# Patient Record
Sex: Male | Born: 1975 | Race: White | Hispanic: No | Marital: Married
Health system: Midwestern US, Community
[De-identification: ages and names within clinical notes are randomized; demographics above are authoritative.]

## PROBLEM LIST (undated history)

## (undated) DIAGNOSIS — I10 Essential (primary) hypertension: Secondary | ICD-10-CM

## (undated) DIAGNOSIS — E119 Type 2 diabetes mellitus without complications: Secondary | ICD-10-CM

## (undated) DIAGNOSIS — F901 Attention-deficit hyperactivity disorder, predominantly hyperactive type: Secondary | ICD-10-CM

## (undated) HISTORY — DX: Essential (primary) hypertension: I10

## (undated) HISTORY — DX: Type 2 diabetes mellitus without complications: E11.9

---

## 2009-01-22 NOTE — ED Provider Notes (Unsigned)
PATIENT NAME                  PA #             MR #                  [name]                 [id number]       [id number]            EMERGENCY ROOM PHYSICIAN                 ADM DATE                     Jeff Batman, MD                       01/22/2009                   DATE OF BIRTH    AGE            PATIENT TYPE      RM #               Jun 17, 1976       32             ERK                                     CHIEF COMPLAINT:  Bee sting.       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  He had what sounds to be a pretty serious  event when he was much younger and does not get stung that often so it has  been a few years since he history been stung and it does not sound like he  has had any further anaphylactic issues and it is not even clear that he had  anaphylaxis initially.  Regardless, he was stung on the left ear about 2   hours ago and about an 1 hour because he had some swelling develop under his  right axilla he took two Benadryl tablets.  This was about 45 minutes to an  hour ago and he feels a big groggy but at no point did he have any trouble  with speech, feel lightheaded, or have any palpitations, any shortness of  breath or any swelling about the mouth or throat.  He says other than feeling  a little groggy he feels fine.       PAST MEDICAL HISTORY:  Essentially negative for chronic medical problems.  He  did some episode or an irregular heart beat and fast but nothing chronic.  No  other current medications.       ALLERGIES:  BEE STINGS.       SOCIAL HISTORY:  He does not smoke.  Her drinks alcohol socially.      FAMILY HISTORY: Noncontributory.       REVIEW OF SYSTEMS:  All other systems reviewed and negative except as  mentioned above.       PHYSICAL EXAMINATION:  VITAL SIGNS:  Afebrile, pulse 76, respirations 18, and  blood pressure 149/80, oxygen 100% on room air.  GENERAL:  He is awake, alert  and very pleasant.  He appears  to be in no distress whatsoever.  ENT:  There  is no focal swelling about the mouth or throat.  He speaks fluently.  There  is no stridor.  NECK:  Supple.  CARDIOVASCULAR:  Normal S1 and S2.  No  murmurs.  LUNGS:  Sounds are clear without wheezing, rales or rhonchi.  He  breathes comfortably.  ABDOMEN:  Soft.  SKIN:  There is no diffuse urticaria  or even rash seen.  He has a little soft tissue fullness in the right axilla.   It is not a discrete mass and it is not erythematous.  NEUROLOGIC:  Awake,  alert and oriented x3 and nonfocal.       MEDICAL DECISION MAKING AND EMERGENCY DEPARTMENT COURSE:  The patient appears  to have a relatively mild allergic reaction.  Again he was stung over 2   hours ago.  He took his Benadryl and he has no signs of a global or  generalized allergic reaction.  I think at this point he can be safely  discharged.  I am going to have him take Benadryl about every 6 hours for the  next 24 hours or while having symptoms.  He was given 60 mg of prednisone  here.  I will continue that for 3 days.  He was given a prescription for 5  epi pens and was instructed on the physiology of allergic reactions including  its biphasic response.  They are comfortable in going home and their  questions have been answered.       IMPRESSION:  Allergic reaction to bee sting.      DISCHARGE INSTRUCTIONS:  As above.  Return if recurrent significant symptoms.                                         Jeff Batman, MD     KM /[id number]  DD: 01/22/2009 20:56  DT: 01/25/2009 10:51  Job #: [id number]  CC:

## 2013-11-10 ENCOUNTER — Ambulatory Visit (HOSPITAL_BASED_OUTPATIENT_CLINIC_OR_DEPARTMENT_OTHER): Payer: Self-pay

## 2013-11-12 ENCOUNTER — Ambulatory Visit (HOSPITAL_BASED_OUTPATIENT_CLINIC_OR_DEPARTMENT_OTHER): Payer: Self-pay | Attending: Ophthalmology | Admitting: Radiology

## 2013-11-12 VITALS — Ht 69.0 in | Wt 215.0 lb

## 2013-11-12 DIAGNOSIS — G4730 Sleep apnea, unspecified: Secondary | ICD-10-CM | POA: Insufficient documentation

## 2013-11-22 DIAGNOSIS — G4710 Hypersomnia, unspecified: Secondary | ICD-10-CM

## 2013-11-22 DIAGNOSIS — G4730 Sleep apnea, unspecified: Secondary | ICD-10-CM

## 2013-11-22 NOTE — Sleep Study (Signed)
   NAME: Kevin Yates DATE OF BIRTH:  21-Feb-1976 MEDICAL RECORD NUMBER [id number]  LOCATION: [facility]  PHYSICIAN: Nino Amano D Shirleen Mcfaul  DATE OF STUDY: 11/12/2013  SLEEP STUDY TYPE: Nocturnal Polysomnogram               REFERRING PHYSICIAN: Corinda Gubler, MD  INDICATION FOR STUDY: Hypersomnia with sleep apnea  EPWORTH SLEEPINESS SCORE:   9/24 HEIGHT: 5\' 9"  (175.3 cm)  WEIGHT: 215 lb (97.523 kg)    Body mass index is 31.74 kg/(m^2).  NECK SIZE: 17 in.  MEDICATIONS: Charted for review  SLEEP ARCHITECTURE: Split study protocol. During the titration phase, total sleep time 117.5 minutes with sleep efficiency 58.3%. Stage I was 30.2%, stage II 53.2%, stage III hasn't, REM 16.6% of total sleep time. Sleep latency 68 minutes, REM latency 73 minutes, awake after sleep onset 14 minutes, arousal index 65.4. Bedtime medication: None  RESPIRATORY DATA:  Apnea hypopnea index (AHI) 87.8 per hour. 172 total events scored including 147 obstructive apneas, 8 central apneas, 12 mixed apneas, 5 hypopneas. Events were all associated with supine sleep position. REM AHI 64.6 per hour. CPAP was titrated to 17 CWP, AHI 3.9 per hour. He wore a medium [employer] nasal mask with heated humidifier.  OXYGEN DATA: Moderate to very loud snoring before CPAP with oxygen desaturation to a nadir of 61% on room air. With CPAP control, snoring was prevented and mean oxygen saturation of 96.8% on room air.  CARDIAC DATA: Sinus rhythm with PACs including blocked PACs, and PVCs  MOVEMENT/PARASOMNIA: No significant movement disturbance, bathroom x1. Bruxism.  IMPRESSION/ RECOMMENDATION:   1) Severe obstructive sleep apnea/hypoxia syndrome, AHI 87.8 per hour with supine events. Moderate to extremely loud snoring with oxygen desaturation to a nadir of 61% on room air. 2) Successful CPAP titration to 17 CWP, AHI 3.9 per hour. He wore a medium [employer] Eson nasal mask with heated humidifier.  Snoring was prevented and mean oxygen saturation of 96.8% on room air with CPAP.   Signed Jetty Duhamel M.D. Waymon Budge Diplomate, Biomedical engineer of Sleep Medicine  ELECTRONICALLY SIGNED ON:  11/22/2013, 3:11 PM [facility] PH: [phone]   FX: [phone] ACCREDITED BY THE AMERICAN ACADEMY OF SLEEP MEDICINE

## 2015-12-13 ENCOUNTER — Ambulatory Visit (INDEPENDENT_AMBULATORY_CARE_PROVIDER_SITE_OTHER): Payer: BLUE CROSS/BLUE SHIELD | Admitting: Physician Assistant

## 2015-12-13 ENCOUNTER — Encounter: Payer: Self-pay | Admitting: Physician Assistant

## 2015-12-13 VITALS — BP 184/100 | HR 120 | Temp 99.4°F | Resp 18 | Ht 69.5 in | Wt 214.0 lb

## 2015-12-13 DIAGNOSIS — Z23 Encounter for immunization: Secondary | ICD-10-CM

## 2015-12-13 DIAGNOSIS — R358 Other polyuria: Secondary | ICD-10-CM | POA: Diagnosis not present

## 2015-12-13 DIAGNOSIS — Z7189 Other specified counseling: Secondary | ICD-10-CM

## 2015-12-13 DIAGNOSIS — I10 Essential (primary) hypertension: Secondary | ICD-10-CM | POA: Diagnosis not present

## 2015-12-13 DIAGNOSIS — Z7689 Persons encountering health services in other specified circumstances: Secondary | ICD-10-CM

## 2015-12-13 DIAGNOSIS — R3589 Other polyuria: Secondary | ICD-10-CM

## 2015-12-13 DIAGNOSIS — Z0000 Encounter for general adult medical examination without abnormal findings: Secondary | ICD-10-CM | POA: Diagnosis not present

## 2015-12-13 DIAGNOSIS — Z720 Tobacco use: Secondary | ICD-10-CM | POA: Diagnosis not present

## 2015-12-13 DIAGNOSIS — R631 Polydipsia: Secondary | ICD-10-CM | POA: Diagnosis not present

## 2015-12-13 DIAGNOSIS — F17200 Nicotine dependence, unspecified, uncomplicated: Secondary | ICD-10-CM

## 2015-12-13 DIAGNOSIS — R350 Frequency of micturition: Secondary | ICD-10-CM | POA: Diagnosis not present

## 2015-12-13 LAB — URINALYSIS, MICROSCOPIC ONLY
BACTERIA UA: NONE SEEN [HPF]
CASTS: NONE SEEN [LPF]
CRYSTALS: NONE SEEN [HPF]
RBC / HPF: NONE SEEN RBC/HPF (ref <=2)
WBC UA: NONE SEEN WBC/HPF (ref <=5)
Yeast: NONE SEEN [HPF]

## 2015-12-13 LAB — URINALYSIS, ROUTINE W REFLEX MICROSCOPIC
BILIRUBIN URINE: NEGATIVE
Leukocytes, UA: NEGATIVE
Nitrite: NEGATIVE
Protein, ur: NEGATIVE
SPECIFIC GRAVITY, URINE: 1.005 (ref 1.001–1.035)
pH: 5 (ref 5.0–8.0)

## 2015-12-13 MED ORDER — METOPROLOL SUCCINATE ER 25 MG PO TB24: 25.0000 mg | ORAL_TABLET | Freq: Every day | ORAL | Status: DC

## 2015-12-13 MED ORDER — HYDROCHLOROTHIAZIDE 25 MG PO TABS: 25.0000 mg | ORAL_TABLET | Freq: Every day | ORAL | Status: DC

## 2015-12-14 ENCOUNTER — Ambulatory Visit (INDEPENDENT_AMBULATORY_CARE_PROVIDER_SITE_OTHER): Payer: BLUE CROSS/BLUE SHIELD | Admitting: Family Medicine

## 2015-12-14 ENCOUNTER — Encounter: Payer: Self-pay | Admitting: Family Medicine

## 2015-12-14 ENCOUNTER — Encounter: Payer: Self-pay | Admitting: Physician Assistant

## 2015-12-14 ENCOUNTER — Other Ambulatory Visit: Payer: BLUE CROSS/BLUE SHIELD

## 2015-12-14 VITALS — BP 142/84 | HR 88 | Temp 98.9°F | Resp 16 | Ht 69.5 in | Wt 214.0 lb

## 2015-12-14 DIAGNOSIS — IMO0001 Reserved for inherently not codable concepts without codable children: Secondary | ICD-10-CM

## 2015-12-14 DIAGNOSIS — E1165 Type 2 diabetes mellitus with hyperglycemia: Secondary | ICD-10-CM

## 2015-12-14 DIAGNOSIS — Z0000 Encounter for general adult medical examination without abnormal findings: Secondary | ICD-10-CM

## 2015-12-14 LAB — COMPLETE METABOLIC PANEL WITH GFR
ALBUMIN: 4.4 g/dL (ref 3.6–5.1)
ALK PHOS: 114 U/L (ref 40–115)
ALT: 14 U/L (ref 9–46)
AST: 10 U/L (ref 10–40)
BUN: 12 mg/dL (ref 7–25)
CHLORIDE: 90 mmol/L — AB (ref 98–110)
CO2: 18 mmol/L — ABNORMAL LOW (ref 20–31)
CREATININE: 1.14 mg/dL (ref 0.60–1.35)
Calcium: 9.2 mg/dL (ref 8.6–10.3)
GFR, Est Non African American: 81 mL/min (ref >=60)
Glucose, Bld: 586 mg/dL (ref 70–99)
Potassium: 4.3 mmol/L (ref 3.5–5.3)
Sodium: 125 mmol/L — ABNORMAL LOW (ref 135–146)
TOTAL PROTEIN: 7 g/dL (ref 6.1–8.1)
Total Bilirubin: 0.6 mg/dL (ref 0.2–1.2)

## 2015-12-14 LAB — LIPID PANEL
Cholesterol: 349 mg/dL — ABNORMAL HIGH (ref 125–200)
HDL: 33 mg/dL — AB (ref >=40)
Total CHOL/HDL Ratio: 10.6 Ratio — ABNORMAL HIGH (ref <=5.0)
Triglycerides: 2221 mg/dL — ABNORMAL HIGH (ref <150)

## 2015-12-14 LAB — HEMOGLOBIN A1C
Hgb A1c MFr Bld: 15.2 % — ABNORMAL HIGH (ref <5.7)
Mean Plasma Glucose: 390 mg/dL

## 2015-12-14 LAB — VITAMIN D 25 HYDROXY (VIT D DEFICIENCY, FRACTURES): Vit D, 25-Hydroxy: 7 ng/mL — ABNORMAL LOW (ref 30–100)

## 2015-12-14 LAB — TSH: TSH: 1.73 mIU/L (ref 0.40–4.50)

## 2015-12-14 MED ORDER — METFORMIN HCL 500 MG PO TABS: 500.0000 mg | ORAL_TABLET | Freq: Two times a day (BID) | ORAL | Status: DC

## 2015-12-14 NOTE — Patient Instructions (Signed)
Start metformin take 1 tablet with breakfast for 3 days, then go to 1 tablet with breakfast and dinner  Inject 10units of Tresiba at bedtime Continue blood pressure medication Check your blood sugar three times a day and record- Check Fasting, and before Lunch and Before Bedtime  Increase your fluids - WATER F/U 2 weeks as scheduled  Diabetes and Standards of Medical Care Diabetes is complicated. You may find that your diabetes team includes a dietitian, nurse, diabetes educator, eye doctor, and more. To help everyone know what is going on and to help you get the care you deserve, the following schedule of care was developed to help keep you on track. Below are the tests, exams, vaccines, medicines, education, and plans you will need. HbA1c test This test shows how well you have controlled your glucose over the past 2-3 months. It is used to see if your diabetes management plan needs to be adjusted.   It is performed at least 2 times a year if you are meeting treatment goals.  It is performed 4 times a year if therapy has changed or if you are not meeting treatment goals. Blood pressure test  This test is performed at every routine medical visit. The goal is less than 140/90 mm Hg for most people, but 130/80 mm Hg in some cases. Ask your health care provider about your goal. Dental exam  Follow up with the dentist regularly. Eye exam  If you are diagnosed with type 1 diabetes as a child, get an exam upon reaching the age of 74 years or older and having had diabetes for 3-5 years. Yearly eye exams are recommended after that initial eye exam.  If you are diagnosed with type 1 diabetes as an adult, get an exam within 5 years of diagnosis and then yearly.  If you are diagnosed with type 2 diabetes, get an exam as soon as possible after the diagnosis and then yearly. Foot care exam  Visual foot exams are performed at every routine medical visit. The exams check for cuts, injuries, or other  problems with the feet.  You should have a complete foot exam performed every year. This exam includes an inspection of the structure and skin of your feet, a check of the pulses in your feet, and a check of the sensation in your feet.  Type 1 diabetes: The first exam is performed 5 years after diagnosis.  Type 2 diabetes: The first exam is performed at the time of diagnosis.  Check your feet nightly for cuts, injuries, or other problems with your feet. Tell your health care provider if anything is not healing. Kidney function test (urine microalbumin)  This test is performed once a year.  Type 1 diabetes: The first test is performed 5 years after diagnosis.  Type 2 diabetes: The first test is performed at the time of diagnosis.  A serum creatinine and estimated glomerular filtration rate (eGFR) test is done once a year to assess the level of chronic kidney disease (CKD), if present. Lipid profile (cholesterol, HDL, LDL, triglycerides)  Performed every 5 years for most people.  The goal for LDL is less than 100 mg/dL. If you are at high risk, the goal is less than 70 mg/dL.  The goal for HDL is 40 mg/dL-50 mg/dL for men and 50 mg/dL-60 mg/dL for women. An HDL cholesterol of 60 mg/dL or higher gives some protection against heart disease.  The goal for triglycerides is less than 150 mg/dL. Immunizations  The  flu (influenza) vaccine is recommended yearly for every person 75 months of age or older who has diabetes.  The pneumonia (pneumococcal) vaccine is recommended for every person 59 years of age or older who has diabetes. Adults 30 years of age or older may receive the pneumonia vaccine as a series of two separate shots.  The hepatitis B vaccine is recommended for adults shortly after they have been diagnosed with diabetes.  The Tdap (tetanus, diphtheria, and pertussis) vaccine should be given:  According to normal childhood vaccination schedules, for children.  Every 10 years,  for adults who have diabetes. Diabetes self-management education  Education is recommended at diagnosis and ongoing as needed. Treatment plan  Your treatment plan is reviewed at every medical visit.   This information is not intended to replace advice given to you by your health care provider. Make sure you discuss any questions you have with your health care provider.   Document Released: 05/28/2009 Document Revised: 08/21/2014 Document Reviewed: 12/31/2012 Elsevier Interactive Patient Education 2016 [employer]. Diabetes Mellitus and Food It is important for you to manage your blood sugar (glucose) level. Your blood glucose level can be greatly affected by what you eat. Eating healthier foods in the appropriate amounts throughout the day at about the same time each day will help you control your blood glucose level. It can also help slow or prevent worsening of your diabetes mellitus. Healthy eating may even help you improve the level of your blood pressure and reach or maintain a healthy weight.  General recommendations for healthful eating and cooking habits include:  Eating meals and snacks regularly. Avoid going long periods of time without eating to lose weight.  Eating a diet that consists mainly of plant-based foods, such as fruits, vegetables, nuts, legumes, and whole grains.  Using low-heat cooking methods, such as baking, instead of high-heat cooking methods, such as deep frying. Work with your dietitian to make sure you understand how to use the Nutrition Facts information on food labels. HOW CAN FOOD AFFECT ME? Carbohydrates Carbohydrates affect your blood glucose level more than any other type of food. Your dietitian will help you determine how many carbohydrates to eat at each meal and teach you how to count carbohydrates. Counting carbohydrates is important to keep your blood glucose at a healthy level, especially if you are using insulin or taking certain medicines for  diabetes mellitus. Alcohol Alcohol can cause sudden decreases in blood glucose (hypoglycemia), especially if you use insulin or take certain medicines for diabetes mellitus. Hypoglycemia can be a life-threatening condition. Symptoms of hypoglycemia (sleepiness, dizziness, and disorientation) are similar to symptoms of having too much alcohol.  If your health care provider has given you approval to drink alcohol, do so in moderation and use the following guidelines:  Women should not have more than one drink per day, and men should not have more than two drinks per day. One drink is equal to:  12 oz of beer.  5 oz of wine.  1 oz of hard liquor.  Do not drink on an empty stomach.  Keep yourself hydrated. Have water, diet soda, or unsweetened iced tea.  Regular soda, juice, and other mixers might contain a lot of carbohydrates and should be counted. WHAT FOODS ARE NOT RECOMMENDED? As you make food choices, it is important to remember that all foods are not the same. Some foods have fewer nutrients per serving than other foods, even though they might have the same number of calories or  carbohydrates. It is difficult to get your body what it needs when you eat foods with fewer nutrients. Examples of foods that you should avoid that are high in calories and carbohydrates but low in nutrients include:  Trans fats (most processed foods list trans fats on the Nutrition Facts label).  Regular soda.  Juice.  Candy.  Sweets, such as cake, pie, doughnuts, and cookies.  Fried foods. WHAT FOODS CAN I EAT? Eat nutrient-rich foods, which will nourish your body and keep you healthy. The food you should eat also will depend on several factors, including:  The calories you need.  The medicines you take.  Your weight.  Your blood glucose level.  Your blood pressure level.  Your cholesterol level. You should eat a variety of foods, including:  Protein.  Lean cuts of meat.  Proteins low  in saturated fats, such as fish, egg whites, and beans. Avoid processed meats.  Fruits and vegetables.  Fruits and vegetables that may help control blood glucose levels, such as apples, mangoes, and yams.  Dairy products.  Choose fat-free or low-fat dairy products, such as milk, yogurt, and cheese.  Grains, bread, pasta, and rice.  Choose whole grain products, such as multigrain bread, whole oats, and brown rice. These foods may help control blood pressure.  Fats.  Foods containing healthful fats, such as nuts, avocado, olive oil, canola oil, and fish. DOES EVERYONE WITH DIABETES MELLITUS HAVE THE SAME MEAL PLAN? Because every person with diabetes mellitus is different, there is not one meal plan that works for everyone. It is very important that you meet with a dietitian who will help you create a meal plan that is just right for you.   This information is not intended to replace advice given to you by your health care provider. Make sure you discuss any questions you have with your health care provider.   Document Released: 04/27/2005 Document Revised: 08/21/2014 Document Reviewed: 06/27/2013 Elsevier Interactive Patient Education [employer].

## 2015-12-14 NOTE — Progress Notes (Signed)
Patient ID: Cassie Henkels MRN: [id number], DOB: [birth year] 40 y.o. Date of Encounter: 12/14/2015, 10:11 AM    Chief Complaint: Physical (CPE)  HPI: 40 y.o. y/oAA male here for CPE.   His wife is also here with him for OV today.  Pt reports they have lived in [place] for about 3 years.  Says he had insurance, then was without insurance, now with insurance again.   Says "blood pressure always borderline."  Says "took BP med before moved to [facility]"  Recently, has not seen any medical provider on routine basis. Just U/C as needed for acute illness etc.  Past few weeks, his "mouth is constantly dry" and "urinating frequently."  Smokes about 8-10 cigarettes per day (about 1/2 ppd). For about 20 years. Never more than current amount. About 1/2 ppd for about 20 years.   Works --[employer].  No other complaints/concerns.  No other PMH. Has had no surgeries.  Family History reviewed today. Both mom and dad with HTN. No Diabetes. No family h/o premature CAD or CA.  Review of Systems: Consitutional: No fever, chills, fatigue, night sweats, lymphadenopathy, or weight changes. Eyes: No visual changes, eye redness, or discharge. ENT/Mouth: Ears: No otalgia, tinnitus, hearing loss, discharge. Nose: No congestion, rhinorrhea, sinus pain, or epistaxis. Throat: No sore throat, post nasal drip, or teeth pain. Cardiovascular: No CP, palpitations, diaphoresis, DOE, edema, orthopnea, PND. Respiratory: No cough, hemoptysis, SOB, or wheezing. Gastrointestinal: No anorexia, dysphagia, reflux, pain, nausea, vomiting, hematemesis, diarrhea, constipation, BRBPR, or melena. Genitourinary: No dysuria, frequency, urgency, hematuria, incontinence, nocturia, decreased urinary stream, discharge, impotence, or testicular pain/masses. Musculoskeletal: No decreased ROM, myalgias, stiffness, joint swelling, or weakness. Skin: No rash, erythema, lesion changes, pain, warmth, jaundice, or  pruritis. Neurological: No headache, dizziness, syncope, seizures, tremors, memory loss, coordination problems, or paresthesias. Psychological: No anxiety, depression, hallucinations, SI/HI. Endocrine: No fatigue,  polyphagia, or known diabetes. All other systems were reviewed and are otherwise negative.  No past medical history on file.   No past surgical history on file.  Home Meds:  No outpatient prescriptions prior to visit.   No facility-administered medications prior to visit.    Allergies: No Known Allergies  Social History   Social History  . Marital Status: Unknown    Spouse Name: N/A  . Number of Children: N/A  . Years of Education: N/A   Occupational History  . Not on file.   Social History Main Topics  . Smoking status: Current Every Day Smoker -- 0.50 packs/day    Types: Cigarettes  . Smokeless tobacco: Never Used  . Alcohol Use: 4.8 oz/week    8 Standard drinks or equivalent per week     Comment: mostly on weekend  . Drug Use: No  . Sexual Activity: Not on file   Other Topics Concern  . Not on file   Social History Narrative  . No narrative on file    Family History  Problem Relation Age of Onset  . Hypertension Mother   . Hypertension Father     Physical Exam: Blood pressure 184/100, pulse 120, temperature 99.4 F (37.4 C), temperature source Oral, resp. rate 18, height 5' 9.5" (1.765 m), weight 214 lb (97.07 kg).  General: Well developed, well nourished,AAM. Appears in no acute distress. HEENT: Normocephalic, atraumatic. Conjunctiva pink, sclera non-icteric. Pupils 2 mm constricting to 1 mm, round, regular, and equally reactive to light and accomodation. EOMI. Internal auditory canal clear. TMs with good cone of light and without pathology. Nasal mucosa  pink. Nares are without discharge. No sinus tenderness. Oral mucosa pink. Dentition. Pharynx without exudate.   Neck: Supple. Trachea midline. No thyromegaly. Full ROM. No  lymphadenopathy. Lungs: Clear to auscultation bilaterally without wheezes, rales, or rhonchi. Breathing is of normal effort and unlabored. Cardiovascular: RRR with S1 S2. No murmurs, rubs, or gallops. Distal pulses 2+ symmetrically. No carotid or abdominal bruits. Abdomen: Soft, non-tender, non-distended with normoactive bowel sounds. No hepatosplenomegaly or masses. No rebound/guarding. No CVA tenderness. No hernias. Musculoskeletal: Full range of motion and 5/5 strength throughout.  Skin: Warm and moist without erythema, ecchymosis, wounds, or rash. Neuro: A+Ox3. CN II-XII grossly intact. Moves all extremities spontaneously. Full sensation throughout. Normal gait. DTR 2+ throughout upper and lower extremities.  Psych:  Responds to questions appropriately with a normal affect.   Assessment/Plan:  40 y.o. y/o AA male here for CPE  -1. Encounter to establish care  2. Visit for preventive health examination A. Screening Labs: He is not fasting but cna return fasting in morning for FLP. Wife wants [place] to go ahead and check other labs now and check FLP tomorrow.  - COMPLETE METABOLIC PANEL WITH GFR - TSH - VITAMIN D 25 Hydroxy (Vit-D Deficiency, Fractures) - Hemoglobin A1c - Lipid panel; Future  B. Screening For Prostate Cancer: Given he is African American, will start at age 89  C. Screening For Colorectal Cancer:  Will start at age 28  D. Immunizations: Flu---------------------------N/A Tetanus---------------------He reports he received tetanus vaccine around 2011--says he was shot in thigh-- Pneumococcal-----------He reports he has never had a pneumonia vaccine. Given he is a smoker, needs Pneumovax 23. He is agreeable.     Given here 12/13/2015. Zostavax---------------------Will discuss at age 19  3. Essential hypertension I checked BP myself in each arm. Get about 170-180/110 bilaterally.  A little tachycardic, but rhythm regular.  So, will use Toprol and will use HCTZ (African  American male) Will check lab He will schedule f/u OV 2 weeks to recheck BMET and BP on meds. - COMPLETE METABOLIC PANEL WITH GFR - metoprolol succinate (TOPROL-XL) 25 MG 24 hr tablet; Take 1 tablet (25 mg total) by mouth daily.  Dispense: 30 tablet; Refill: 0 - hydrochlorothiazide (HYDRODIURIL) 25 MG tablet; Take 1 tablet (25 mg total) by mouth daily.  Dispense: 30 tablet; Refill: 0  4. Polyuria Check A1C and BMET glucose - Hemoglobin A1c  5. Polydipsia Check A1C and BMET glucose - Hemoglobin A1c  6. Urinary frequency - Urinalysis, Routine w reflex microscopic (not at [facility])  7. Smoker Will discuss cessation at future f/u visit.  8. Need for prophylactic vaccination against Streptococcus pneumoniae (pneumococcus) - Pneumococcal polysaccharide vaccine 23-valent greater than or equal to 2yo subcutaneous/IM  F/U OV 2 weeks to check BMET and BP.  Signed:   [address] [place], [place]  12/14/2015 10:11 AM

## 2015-12-15 ENCOUNTER — Encounter (HOSPITAL_COMMUNITY): Payer: Self-pay | Admitting: *Deleted

## 2015-12-15 ENCOUNTER — Emergency Department (HOSPITAL_COMMUNITY)
Admission: EM | Admit: 2015-12-15 | Discharge: 2015-12-15 | Disposition: A | Discharge status: Home / Self Care | Payer: BLUE CROSS/BLUE SHIELD | Attending: Emergency Medicine | Admitting: Emergency Medicine

## 2015-12-15 ENCOUNTER — Ambulatory Visit (INDEPENDENT_AMBULATORY_CARE_PROVIDER_SITE_OTHER): Payer: BLUE CROSS/BLUE SHIELD | Admitting: Physician Assistant

## 2015-12-15 ENCOUNTER — Encounter: Payer: Self-pay | Admitting: Family Medicine

## 2015-12-15 ENCOUNTER — Encounter: Payer: Self-pay | Admitting: Physician Assistant

## 2015-12-15 VITALS — BP 152/104 | HR 80 | Temp 98.1°F | Resp 18

## 2015-12-15 DIAGNOSIS — E119 Type 2 diabetes mellitus without complications: Secondary | ICD-10-CM | POA: Diagnosis not present

## 2015-12-15 DIAGNOSIS — I10 Essential (primary) hypertension: Secondary | ICD-10-CM | POA: Diagnosis not present

## 2015-12-15 DIAGNOSIS — K6289 Other specified diseases of anus and rectum: Secondary | ICD-10-CM

## 2015-12-15 DIAGNOSIS — F17210 Nicotine dependence, cigarettes, uncomplicated: Secondary | ICD-10-CM | POA: Insufficient documentation

## 2015-12-15 DIAGNOSIS — E1165 Type 2 diabetes mellitus with hyperglycemia: Secondary | ICD-10-CM

## 2015-12-15 DIAGNOSIS — A630 Anogenital (venereal) warts: Secondary | ICD-10-CM

## 2015-12-15 DIAGNOSIS — Z794 Long term (current) use of insulin: Secondary | ICD-10-CM | POA: Insufficient documentation

## 2015-12-15 DIAGNOSIS — IMO0001 Reserved for inherently not codable concepts without codable children: Secondary | ICD-10-CM

## 2015-12-15 DIAGNOSIS — R198 Other specified symptoms and signs involving the digestive system and abdomen: Secondary | ICD-10-CM

## 2015-12-15 DIAGNOSIS — Z79899 Other long term (current) drug therapy: Secondary | ICD-10-CM | POA: Diagnosis not present

## 2015-12-15 DIAGNOSIS — F419 Anxiety disorder, unspecified: Secondary | ICD-10-CM | POA: Insufficient documentation

## 2015-12-15 DIAGNOSIS — Z7984 Long term (current) use of oral hypoglycemic drugs: Secondary | ICD-10-CM | POA: Insufficient documentation

## 2015-12-15 MED ORDER — POLYETHYLENE GLYCOL 3350 17 G PO PACK: 17.0000 g | PACK | Freq: Every day | ORAL | Status: DC

## 2015-12-15 MED ORDER — DOCUSATE SODIUM 100 MG PO CAPS: 100.0000 mg | ORAL_CAPSULE | Freq: Two times a day (BID) | ORAL | Status: DC

## 2015-12-15 MED ORDER — HYDROCORTISONE 2.5 % RE CREA: TOPICAL_CREAM | RECTAL | Status: DC

## 2015-12-15 MED ORDER — PIOGLITAZONE HCL 45 MG PO TABS
45.0000 mg | ORAL_TABLET | Freq: Every day | ORAL | Status: DC
Start: 2015-12-15 — End: 2016-04-24

## 2015-12-15 MED ORDER — INSULIN DEGLUDEC 100 UNIT/ML ~~LOC~~ SOPN: 10.0000 [IU] | PEN_INJECTOR | Freq: Every day | SUBCUTANEOUS | Status: DC

## 2015-12-15 NOTE — Progress Notes (Signed)
Patient ID: Kevin Yates, male   DOB: May 16, 1976, 40 y.o.   MRN: [id number]   Subjective:    Patient ID: Kevin Yates, male    DOB: Jan 13, 1976, 40 y.o.   MRN: [id number]  Patient presents for F/U Labs Patient here secondary to abnormal labs. He established care here physical exam done yesterday. He is known history of hypertension. His blood glucose was significantly elevated at 586 his sodium was reduced based on his blood glucose being elevated to 125 chloride 90. His A1c returned at 15.2%. His only complaint was urinary frequency and dry mouth which prompted the labs to be done yesterday. He had return for lipid panel  No change in vision, no 1st degree relative with DM    Review Of Systems:  GEN- denies fatigue, fever, weight loss,weakness, recent illness HEENT- denies eye drainage, change in vision, nasal discharge, CVS- denies chest pain, palpitations RESP- denies SOB, cough, wheeze ABD- denies N/V, change in stools, abd painy Neuro- denies headache, dizziness, syncope, seizure activity       Objective:    BP 142/84 mmHg  Pulse 88  Temp(Src) 98.9 F (37.2 C) (Oral)  Resp 16  Ht 5' 9.5" (1.765 m)  Wt 214 lb (97.07 kg)  BMI 31.16 kg/m2 GEN- NAD, alert and oriented x3        Assessment & Plan:      Problem List Items Addressed This Visit    Diabetes mellitus type II, uncontrolled (HCC) - Primary    Uncontrolled new onset diabetes mellitus. Surprisingly he is not that symptomatic. He was shown how to inject insulin today we will start him Tresiba 10 units he will also start metformin titrated to 500 mg twice a day. He was shown how to use a glucometer here in the office. We discussed increasing his water intake to also help bring his blood sugar down and avoiding carbs, so the other sugary beverages and snack foods. He will follow-up in 2 weeks. I'm expecting that his triglycerides will be extremely elevated and his sodium should also correct once his blood sugar comes  down. He will eventually need to be changed to an ACE inhibitor for his blood pressure medication. We will work on the glucose lowering first.  Based on his blood sugars at the next visit I will titrate him up to 1000 mg twice a day of metformin.       Relevant Medications   metFORMIN (GLUCOPHAGE) 500 MG tablet   Insulin Degludec (TRESIBA FLEXTOUCH) 100 UNIT/ML SOPN      Note: This dictation was prepared with Dragon dictation along with smaller phrase technology. Any transcriptional errors that result from this process are unintentional.

## 2015-12-15 NOTE — Progress Notes (Signed)
Patient ID: Kevin Yates MRN: [id number], DOB: [birth year] 40 y.o. Date of Encounter: 12/15/2015, 2:31 PM    Chief Complaint:  "swelling around anal region"  HPI: 40 y.o. y/oAA male here for above.   12/13/2015:  Presents for CPE. His wife is also here with him for OV today.  Pt reports they have lived in [place] for about 3 years.  Says he had insurance, then was without insurance, now with insurance again.   Says "blood pressure always borderline."  Says "took BP med before moved to [facility]"  Recently, has not seen any medical provider on routine basis. Just U/C as needed for acute illness etc.  Past few weeks, his "mouth is constantly dry" and "urinating frequently."  Smokes about 8-10 cigarettes per day (about 1/2 ppd). For about 20 years. Never more than current amount. About 1/2 ppd for about 20 years.   Works --[employer].  No other complaints/concerns.  No other PMH. Has had no surgeries.  Family History reviewed today. Both mom and dad with HTN. No Diabetes. No family h/o premature CAD or CA.   Labs Performed 12/13/2015 significant for:  Glucose-- 586      HgbA1C--- 15.2  He was then scheduled f/u OV here with Dr. Jeanice Lim 12/14/2014.  At that visit, he was taught how to check BS, give Insulin, etc. Was started on Insulin 10 units daily and Metformin 500mg  BID.   Today--12/15/2015: He reports that he took his first dose of Metformin last night.  States that last night, during the night, he had bad abdominal pains and lots of diarrhea.  Says that he does not usually have problems with diarrhea or loose stools.  Says stools are "usually normal".   Pt reports that "anal area seems swollen now".  Discussed that it may be secondary to irritation from diarrhea.  Wife says she wants me to look at it--says she looked at it at home, but "isn't used to looking at that area and doesn't know what it should look like".  I then examined pt---see "Physical Exam" below.  Even  after this, I asked pt, repeatedly, if he had noticed any mass or irritation in his anal region before today---and he repeatedly says that he just noticed / just developed this problem today.  Says he usually has "normal stools" with no difficulty with that either.   Review of Systems: Consitutional: No fever, chills, fatigue, night sweats, lymphadenopathy, or weight changes. Eyes: No visual changes, eye redness, or discharge. ENT/Mouth: Ears: No otalgia, tinnitus, hearing loss, discharge. Nose: No congestion, rhinorrhea, sinus pain, or epistaxis. Throat: No sore throat, post nasal drip, or teeth pain. Cardiovascular: No CP, palpitations, diaphoresis, DOE, edema, orthopnea, PND. Respiratory: No cough, hemoptysis, SOB, or wheezing. Gastrointestinal: No anorexia, dysphagia, reflux, pain, nausea, vomiting, hematemesis, constipation, BRBPR, or melena. Genitourinary: No dysuria, frequency, urgency, hematuria, incontinence, nocturia, decreased urinary stream, discharge, impotence, or testicular pain/masses. Musculoskeletal: No decreased ROM, myalgias, stiffness, joint swelling, or weakness. Skin: No rash, erythema, lesion changes, pain, warmth, jaundice, or pruritis. Neurological: No headache, dizziness, syncope, seizures, tremors, memory loss, coordination problems, or paresthesias. Psychological: No anxiety, depression, hallucinations, SI/HI. Endocrine: No fatigue,  polyphagia, or known diabetes. All other systems were reviewed and are otherwise negative.  Past Medical History  Diagnosis Date  . Hypertension   . Diabetes mellitus without complication (HCC)      No past surgical history on file.  Home Meds:  Outpatient Prescriptions Prior to Visit  Medication Sig Dispense Refill  .  hydrochlorothiazide (HYDRODIURIL) 25 MG tablet Take 1 tablet (25 mg total) by mouth daily. 30 tablet 0  . Insulin Degludec (TRESIBA FLEXTOUCH) 100 UNIT/ML SOPN Inject 10 Units into the skin at bedtime. 1 pen 0  .  metoprolol succinate (TOPROL-XL) 25 MG 24 hr tablet Take 1 tablet (25 mg total) by mouth daily. 30 tablet 0  . metFORMIN (GLUCOPHAGE) 500 MG tablet Take 1 tablet (500 mg total) by mouth 2 (two) times daily with a meal. 180 tablet 3   No facility-administered medications prior to visit.    Allergies: No Known Allergies  Social History   Social History  . Marital Status: Unknown    Spouse Name: N/A  . Number of Children: N/A  . Years of Education: N/A   Occupational History  . Not on file.   Social History Main Topics  . Smoking status: Current Every Day Smoker -- 0.50 packs/day    Types: Cigarettes  . Smokeless tobacco: Never Used  . Alcohol Use: 4.8 oz/week    8 Standard drinks or equivalent per week     Comment: mostly on weekend  . Drug Use: No  . Sexual Activity: Not on file   Other Topics Concern  . Not on file   Social History Narrative    Family History  Problem Relation Age of Onset  . Hypertension Mother   . Hypertension Father     Physical Exam: Blood pressure 152/104, pulse 80, temperature 98.1 F (36.7 C), temperature source Oral, resp. rate 18.  General: Well developed, well nourished,AAM. Appears in no acute distress. Neck: Supple. Trachea midline. No thyromegaly. Full ROM. No lymphadenopathy. Lungs: Clear to auscultation bilaterally without wheezes, rales, or rhonchi. Breathing is of normal effort and unlabored. Cardiovascular: RRR with S1 S2. No murmurs, rubs, or gallops. Distal pulses 2+ symmetrically. No carotid or abdominal bruits. Musculoskeletal: Full range of motion and 5/5 strength throughout.  Skin: Warm and moist. Inspection of Anus: With pt bent forward, inspected anal region. Could not visualize anus as it is obstructed by "fleshy mass". Does not appear c/w hemorrhoid. Does not appear c/w intestine. Not smooth. Not shiny. Possible perianal wart or other mass.  Neuro: A+Ox3. CN II-XII grossly intact. Moves all extremities spontaneously. Full  sensation throughout. Normal gait.  Psych:  Responds to questions appropriately with a normal affect.   Assessment/Plan:  40 y.o. y/o AA male here for  1. Rectal mass Pt here with wife, who can drive him directly/immediatley to ER. Thye both voice understanding and agree to go directly/immediatley to ER.   2. Uncontrolled type 2 diabetes mellitus without complication, without long-term current use of insulin (HCC) Will stop Metformin as this is causing abdominal pain and diarrhea. Will change this to Actos. Cont Insulin 10 units daily.  Has f/u OV scheduled. - pioglitazone (ACTOS) 45 MG tablet; Take 1 tablet (45 mg total) by mouth daily.  Dispense: 30 tablet; Refill: 3  3. Essential hypertension Just started BP meds at OV 12/13/2015. Will f/u this at next OV.    THE FOLLOWING IS COPIED FROM OV NOTE 12/13/2015:   -1. Encounter to establish care  2. Visit for preventive health examination A. Screening Labs: He is not fasting but cna return fasting in morning for FLP. Wife wants [place] to go ahead and check other labs now and check FLP tomorrow.  - COMPLETE METABOLIC PANEL WITH GFR - TSH - VITAMIN D 25 Hydroxy (Vit-D Deficiency, Fractures) - Hemoglobin A1c - Lipid panel; Future  B. Screening  For Prostate Cancer: Given he is African American, will start at age 47  C. Screening For Colorectal Cancer:  Will start at age 28  D. Immunizations: Flu---------------------------N/A Tetanus---------------------He reports he received tetanus vaccine around 2011--says he was shot in thigh-- Pneumococcal-----------He reports he has never had a pneumonia vaccine. Given he is a smoker, needs Pneumovax 23. He is agreeable.     Given here 12/13/2015. Zostavax---------------------Will discuss at age 71  3. Essential hypertension I checked BP myself in each arm. Get about 170-180/110 bilaterally.  A little tachycardic, but rhythm regular.  So, will use Toprol and will use HCTZ (African American  male) Will check lab He will schedule f/u OV 2 weeks to recheck BMET and BP on meds. - COMPLETE METABOLIC PANEL WITH GFR - metoprolol succinate (TOPROL-XL) 25 MG 24 hr tablet; Take 1 tablet (25 mg total) by mouth daily.  Dispense: 30 tablet; Refill: 0 - hydrochlorothiazide (HYDRODIURIL) 25 MG tablet; Take 1 tablet (25 mg total) by mouth daily.  Dispense: 30 tablet; Refill: 0  4. Polyuria Check A1C and BMET glucose - Hemoglobin A1c  5. Polydipsia Check A1C and BMET glucose - Hemoglobin A1c  6. Urinary frequency - Urinalysis, Routine w reflex microscopic (not at [facility])  7. Smoker Will discuss cessation at future f/u visit.  8. Need for prophylactic vaccination against Streptococcus pneumoniae (pneumococcus) - Pneumococcal polysaccharide vaccine 23-valent greater than or equal to 2yo subcutaneous/IM  F/U OV 2 weeks to check BMET and BP.  Signed:   [address] [place], [place]  12/15/2015 2:31 PM

## 2015-12-15 NOTE — Assessment & Plan Note (Signed)
Uncontrolled new onset diabetes mellitus. Surprisingly he is not that symptomatic. He was shown how to inject insulin today we will start him Tresiba 10 units he will also start metformin titrated to 500 mg twice a day. He was shown how to use a glucometer here in the office. We discussed increasing his water intake to also help bring his blood sugar down and avoiding carbs, so the other sugary beverages and snack foods. He will follow-up in 2 weeks. I'm expecting that his triglycerides will be extremely elevated and his sodium should also correct once his blood sugar comes down. He will eventually need to be changed to an ACE inhibitor for his blood pressure medication. We will work on the glucose lowering first.  Based on his blood sugars at the next visit I will titrate him up to 1000 mg twice a day of metformin.

## 2015-12-15 NOTE — ED Provider Notes (Signed)
CSN: [id number]     Arrival date & time 12/15/15  1335 History   First MD Initiated Contact with Patient 12/15/15 1603     Chief Complaint  Patient presents with  . Hemorrhoids  . Rectal Pain     (Consider location/radiation/quality/duration/timing/severity/associated sxs/prior Treatment) The history is provided by the patient.  Kevin Yates is a 40 y.o. male hx of HTN, DM here with rectal bleeding. He was constipated yesterday and was straining and then had a bloody bowel movement. Patient then felt something that looked like hemorrhoids. He went to PCP today and sent for evaluation. No known hemorrhoids before. Denies abdominal pain or vomiting.   Past Medical History  Diagnosis Date  . Hypertension   . Diabetes mellitus without complication (HCC)    History reviewed. No pertinent past surgical history. Family History  Problem Relation Age of Onset  . Hypertension Mother   . Hypertension Father    Social History  Substance Use Topics  . Smoking status: Current Every Day Smoker -- 0.50 packs/day    Types: Cigarettes  . Smokeless tobacco: Never Used  . Alcohol Use: 4.8 oz/week    8 Standard drinks or equivalent per week     Comment: mostly on weekend    Review of Systems  Gastrointestinal: Positive for blood in stool.  All other systems reviewed and are negative.     Allergies  Metformin and related  Home Medications   Prior to Admission medications   Medication Sig Start Date End Date Taking? Authorizing Provider  hydrochlorothiazide (HYDRODIURIL) 25 MG tablet Take 1 tablet (25 mg total) by mouth daily. 12/13/15  Yes Mary B Dixon, PA-C  Insulin Degludec (TRESIBA FLEXTOUCH) 100 UNIT/ML SOPN Inject 10 Units into the skin at bedtime. 12/15/15  Yes Salley Scarlet, MD  metFORMIN (GLUCOPHAGE) 500 MG tablet Take 500 mg by mouth daily with breakfast.  12/14/15  Yes Historical Provider, MD  metoprolol succinate (TOPROL-XL) 25 MG 24 hr tablet Take 1 tablet (25 mg total) by mouth  daily. 12/13/15  Yes Mary B Dixon, PA-C  docusate sodium (COLACE) 100 MG capsule Take 1 capsule (100 mg total) by mouth every 12 (twelve) hours. 12/15/15   Richardean Canal, MD  hydrocortisone (ANUSOL-HC) 2.5 % rectal cream Apply rectally 2 times daily 12/15/15   Richardean Canal, MD  pioglitazone (ACTOS) 45 MG tablet Take 1 tablet (45 mg total) by mouth daily. 12/15/15   Patriciaann Clan Dixon, PA-C  polyethylene glycol (MIRALAX / GLYCOLAX) packet Take 17 g by mouth daily. 12/15/15   Richardean Canal, MD   BP 138/99 mmHg  Pulse 116  Temp(Src) 98.4 F (36.9 C) (Oral)  Resp 16  SpO2 96% Physical Exam  Constitutional: He is oriented to person, place, and time. He appears well-developed.  Anxious   HENT:  Head: Normocephalic.  Eyes: Pupils are equal, round, and reactive to light.  Neck: Normal range of motion. Neck supple.  Cardiovascular: Normal rate.   Pulmonary/Chest: Effort normal and breath sounds normal. No respiratory distress. He has no wheezes. He has no rales.  Abdominal: Soft. Bowel sounds are normal. He exhibits no distension. There is no tenderness. There is no rebound.  Genitourinary:  Rectal- complex cauliflower shaped mass from the rectal, small amount of bleeding, not reducible but soft and not thrombosed. No obvious perirectal abscess   Musculoskeletal: Normal range of motion.  Neurological: He is alert and oriented to person, place, and time.  Skin: Skin is warm.  Psychiatric: He  has a normal mood and affect. His behavior is normal. Judgment and thought content normal.  Nursing note and vitals reviewed.   ED Course  Procedures (including critical care time) Labs Review Labs Reviewed - No data to display  Imaging Review No results found. I have personally reviewed and evaluated these images and lab results as part of my medical decision-making.   EKG Interpretation None      MDM   Final diagnoses:  Condyloma    [name] is a 40 y.o. male here with condyloma or genital wart. Denies  hx of HIV. Sexually active with wife only. No anal sex before. It doesn't appear like thrombosed hemorrhoids. It is not reducible but is soft. Tachycardia likely from anxiety and was tachy in the office earlier in the day. He doesn't need emergent surgery today. Will give anusol cream, start colace and miralax and have him call surgery to arrange for excision.    Richardean Canal, MD 12/15/15 [phone]

## 2015-12-15 NOTE — ED Notes (Signed)
Pt sent here from doctor's office due to severe hemorrhoids. Pt states the doctor's office feels the pt needs a surgical consult. Pt states the pain in his rectum is 10/10.

## 2015-12-15 NOTE — Discharge Instructions (Signed)
Stay hydrated.   Take miralax daily and colace to keep your stool soft.   Use anusol cream twice daily.   Use sitz bath (warm soak to your bottom) 2-3 times daily.   You are likely going to have some bleeding.   Call surgery for follow up   Return to ER if you have severe pain, uncontrolled bleeding, vomiting, fever.    Genital Warts Genital warts are small growths in the genital area or anal area. They are caused by a type of germ (HPV virus). This germ is spread from person to person during sex. It can be spread through vaginal, anal, and oral sex. Genital warts can lead to other problems if they are not treated. HOME CARE Medicines  Apply over-the-counter and prescription medicines only as told by your doctor.  Do not use medicines that are meant for treating hand warts.  Talk with your doctor about using anti-itch creams. General Instructions  Do not touch or scratch the warts.  Do not have sex until your treatment is done.  Tell your current and past sexual partners about your condition. They may need treatment.  Keep all follow-up visits as told by your doctor. This is important.  After treatment, use condoms during sex. Other Instructions for Women  Women who have genital warts may need to be checked more often for cervical cancer.  If you become pregnant, tell your doctor that you have had genital warts. The germ can be passed to the baby. GET HELP IF:  You have redness, swelling, or pain in the area of the treated skin.  You have a fever.  You feel generally sick.  You feel lumps in and around your genital area or anal area.  You have bleeding in your genital area or anal area.  You have pain during sex.   This information is not intended to replace advice given to you by your health care provider. Make sure you discuss any questions you have with your health care provider.   Document Released: 10/25/2009 Document Revised: 04/21/2015 Document  Reviewed: 10/26/2014 Elsevier Interactive Patient Education [employer]

## 2015-12-16 ENCOUNTER — Encounter (HOSPITAL_COMMUNITY): Payer: Self-pay

## 2015-12-16 ENCOUNTER — Other Ambulatory Visit: Payer: Self-pay | Admitting: Family Medicine

## 2015-12-16 ENCOUNTER — Observation Stay (HOSPITAL_COMMUNITY)
Admission: AD | Admit: 2015-12-16 | Discharge: 2015-12-19 | Disposition: A | Discharge status: Home / Self Care | Payer: BLUE CROSS/BLUE SHIELD | Source: Ambulatory Visit | Attending: General Surgery | Admitting: General Surgery

## 2015-12-16 ENCOUNTER — Other Ambulatory Visit (INDEPENDENT_AMBULATORY_CARE_PROVIDER_SITE_OTHER): Payer: Self-pay | Admitting: Physician Assistant

## 2015-12-16 DIAGNOSIS — T502X5A Adverse effect of carbonic-anhydrase inhibitors, benzothiadiazides and other diuretics, initial encounter: Secondary | ICD-10-CM | POA: Insufficient documentation

## 2015-12-16 DIAGNOSIS — Z794 Long term (current) use of insulin: Secondary | ICD-10-CM | POA: Diagnosis not present

## 2015-12-16 DIAGNOSIS — E1165 Type 2 diabetes mellitus with hyperglycemia: Secondary | ICD-10-CM | POA: Insufficient documentation

## 2015-12-16 DIAGNOSIS — E871 Hypo-osmolality and hyponatremia: Secondary | ICD-10-CM | POA: Diagnosis not present

## 2015-12-16 DIAGNOSIS — T383X5A Adverse effect of insulin and oral hypoglycemic [antidiabetic] drugs, initial encounter: Secondary | ICD-10-CM | POA: Insufficient documentation

## 2015-12-16 DIAGNOSIS — K6289 Other specified diseases of anus and rectum: Secondary | ICD-10-CM

## 2015-12-16 DIAGNOSIS — E785 Hyperlipidemia, unspecified: Secondary | ICD-10-CM | POA: Diagnosis not present

## 2015-12-16 DIAGNOSIS — E118 Type 2 diabetes mellitus with unspecified complications: Secondary | ICD-10-CM | POA: Diagnosis not present

## 2015-12-16 DIAGNOSIS — Z720 Tobacco use: Secondary | ICD-10-CM

## 2015-12-16 DIAGNOSIS — K648 Other hemorrhoids: Secondary | ICD-10-CM | POA: Diagnosis not present

## 2015-12-16 DIAGNOSIS — K644 Residual hemorrhoidal skin tags: Secondary | ICD-10-CM | POA: Diagnosis present

## 2015-12-16 DIAGNOSIS — K645 Perianal venous thrombosis: Secondary | ICD-10-CM | POA: Diagnosis not present

## 2015-12-16 DIAGNOSIS — A630 Anogenital (venereal) warts: Secondary | ICD-10-CM

## 2015-12-16 DIAGNOSIS — Z79899 Other long term (current) drug therapy: Secondary | ICD-10-CM | POA: Diagnosis not present

## 2015-12-16 DIAGNOSIS — I10 Essential (primary) hypertension: Secondary | ICD-10-CM | POA: Diagnosis not present

## 2015-12-16 DIAGNOSIS — E876 Hypokalemia: Secondary | ICD-10-CM | POA: Diagnosis not present

## 2015-12-16 DIAGNOSIS — F17210 Nicotine dependence, cigarettes, uncomplicated: Secondary | ICD-10-CM | POA: Diagnosis not present

## 2015-12-16 DIAGNOSIS — E119 Type 2 diabetes mellitus without complications: Secondary | ICD-10-CM | POA: Diagnosis present

## 2015-12-16 LAB — GLUCOSE, CAPILLARY
Glucose-Capillary: 360 mg/dL — ABNORMAL HIGH (ref 65–99)
Glucose-Capillary: 348 mg/dL — ABNORMAL HIGH (ref 65–99)

## 2015-12-16 LAB — BASIC METABOLIC PANEL
ANION GAP: 18 — AB (ref 5–15)
BUN: 16 mg/dL (ref 6–20)
CHLORIDE: 92 mmol/L — AB (ref 101–111)
CO2: 20 mmol/L — ABNORMAL LOW (ref 22–32)
CREATININE: 1.06 mg/dL (ref 0.61–1.24)
Calcium: 9.8 mg/dL (ref 8.9–10.3)
GFR calc non Af Amer: >60 mL/min (ref >60)
Glucose, Bld: 346 mg/dL — ABNORMAL HIGH (ref 65–99)
POTASSIUM: 3.4 mmol/L — AB (ref 3.5–5.1)
SODIUM: 130 mmol/L — AB (ref 135–145)

## 2015-12-16 LAB — CBC
HCT: 40.1 % (ref 39.0–52.0)
Hemoglobin: 14.3 g/dL (ref 13.0–17.0)
MCH: 27.7 pg (ref 26.0–34.0)
MCHC: 35.7 g/dL (ref 30.0–36.0)
MCV: 77.6 fL — AB (ref 78.0–100.0)
PLATELETS: 228 10*3/uL (ref 150–400)
RBC: 5.17 MIL/uL (ref 4.22–5.81)
RDW: 14.8 % (ref 11.5–15.5)
WBC: 6.7 10*3/uL (ref 4.0–10.5)

## 2015-12-16 MED ORDER — OXYCODONE-ACETAMINOPHEN 5-325 MG PO TABS
1.0000 | ORAL_TABLET | ORAL | Status: DC | PRN
  Administered 2015-12-16: 2 via ORAL
  Filled 2015-12-16: qty 2

## 2015-12-16 MED ORDER — DOCUSATE SODIUM 100 MG PO CAPS: 100.0000 mg | ORAL_CAPSULE | Freq: Two times a day (BID) | ORAL | Status: DC

## 2015-12-16 MED ORDER — INSULIN DETEMIR 100 UNIT/ML ~~LOC~~ SOLN
30.0000 [IU] | Freq: Every day | SUBCUTANEOUS | Status: DC
  Administered 2015-12-16 – 2015-12-17 (×2): 30 [IU] via SUBCUTANEOUS
  Filled 2015-12-16 (×2): qty 0.3

## 2015-12-16 MED ORDER — HYDROMORPHONE HCL 1 MG/ML IJ SOLN
1.0000 mg | INTRAMUSCULAR | Status: DC | PRN
  Administered 2015-12-16 – 2015-12-17 (×2): 1 mg via INTRAVENOUS
  Filled 2015-12-16 (×2): qty 1

## 2015-12-16 MED ORDER — SENNOSIDES-DOCUSATE SODIUM 8.6-50 MG PO TABS
1.0000 | ORAL_TABLET | Freq: Two times a day (BID) | ORAL | Status: DC
  Administered 2015-12-16 – 2015-12-17 (×3): 1 via ORAL
  Filled 2015-12-16 (×4): qty 1

## 2015-12-16 MED ORDER — NICOTINE 14 MG/24HR TD PT24
14.0000 mg | MEDICATED_PATCH | Freq: Every day | TRANSDERMAL | Status: DC
  Filled 2015-12-16 (×4): qty 1

## 2015-12-16 MED ORDER — INSULIN DETEMIR 100 UNIT/ML ~~LOC~~ SOLN
20.0000 [IU] | Freq: Every day | SUBCUTANEOUS | Status: DC
  Filled 2015-12-16: qty 0.2

## 2015-12-16 MED ORDER — INSULIN DETEMIR 100 UNIT/ML ~~LOC~~ SOLN: 20.0000 [IU] | Freq: Every day | SUBCUTANEOUS | Status: DC

## 2015-12-16 MED ORDER — INSULIN STARTER KIT- PEN NEEDLES (ENGLISH)
1.0000 | Freq: Once | Status: AC
  Administered 2015-12-17: 1
  Filled 2015-12-16: qty 1

## 2015-12-16 MED ORDER — POTASSIUM CHLORIDE CRYS ER 20 MEQ PO TBCR
40.0000 meq | EXTENDED_RELEASE_TABLET | ORAL | Status: AC
  Administered 2015-12-16 – 2015-12-17 (×2): 40 meq via ORAL
  Filled 2015-12-16 (×2): qty 2

## 2015-12-16 MED ORDER — INSULIN ASPART 100 UNIT/ML ~~LOC~~ SOLN
0.0000 [IU] | Freq: Every day | SUBCUTANEOUS | Status: DC
  Administered 2015-12-16: 5 [IU] via SUBCUTANEOUS
  Administered 2015-12-17: 3 [IU] via SUBCUTANEOUS

## 2015-12-16 MED ORDER — MAGNESIUM SULFATE (LAXATIVE) PO GRAN
100.0000 g | GRANULES | Freq: Four times a day (QID) | ORAL | Status: DC
  Administered 2015-12-16 – 2015-12-19 (×10): 100 g
  Filled 2015-12-16 (×3): qty 454

## 2015-12-16 MED ORDER — LACTATED RINGERS IV SOLN
INTRAVENOUS | Status: DC
  Administered 2015-12-16: 75 mL/h via INTRAVENOUS
  Administered 2015-12-17 (×3): via INTRAVENOUS

## 2015-12-16 MED ORDER — LIVING WELL WITH DIABETES BOOK
Freq: Once | Status: AC
  Administered 2015-12-16: 18:00:00
  Filled 2015-12-16: qty 1

## 2015-12-16 MED ORDER — DIBUCAINE 1 % EX OINT
TOPICAL_OINTMENT | Freq: Four times a day (QID) | CUTANEOUS | Status: DC
  Administered 2015-12-16 (×2): via TOPICAL
  Filled 2015-12-16: qty 28.35

## 2015-12-16 MED ORDER — INSULIN ASPART 100 UNIT/ML ~~LOC~~ SOLN
0.0000 [IU] | Freq: Three times a day (TID) | SUBCUTANEOUS | Status: DC
  Administered 2015-12-16: 15 [IU] via SUBCUTANEOUS
  Administered 2015-12-17: 11 [IU] via SUBCUTANEOUS
  Administered 2015-12-17: 3 [IU] via SUBCUTANEOUS
  Administered 2015-12-17 – 2015-12-18 (×2): 8 [IU] via SUBCUTANEOUS

## 2015-12-16 NOTE — H&P (Signed)
History of Present Illness Kevin Yates Berenize Gatlin PA C; 12/16/2015 11:56 AM) The patient is a 40 year old male who presents with hemorrhoids. Kevin Yates is a 40 year old patient who is referred to [place] via Chaney Malling MD in the emergency department for what he explains is a 2 day history of swelling and pain to the rectal area. The patient was seen by his primary care physician yesterday and was directed to go to the emergency department for increased pain and rectal bleeding. He was seen in the emergency department where he was diagnosed with possibility of thrombosed hemorrhoids versus condyloma. The patient denies any previous history of this. He said he had been placed on metformin and had a rather significant bowel movement and began to notice the bleeding. He denies any history of sexually transmitted disease or unprotected exposure other than with his wife. He denies any fever.   Other Problems Fay Records, CMA; 12/16/2015 11:14 AM) High blood pressure Hypercholesterolemia Other disease, cancer, significant illness  Past Surgical History Fay Records, CMA; 12/16/2015 11:14 AM) No pertinent past surgical history  Diagnostic Studies History Fay Records, CMA; 12/16/2015 11:14 AM) Colonoscopy never  Allergies Fay Records, CMA; 12/16/2015 11:15 AM) No Known Drug Allergies05/11/2015  Medication History Fay Records, CMA; 12/16/2015 11:15 AM) HydroCHLOROthiazide (25MG  Tablet, Oral) Active. Metoprolol Succinate ER (25MG  Tablet ER 24HR, Oral) Active. Pioglitazone HCl (45MG  Tablet, Oral) Active. Insulin Degludec (100UNIT/ML Soln Pen-inj, Subcutaneous) Active. Medications Reconciled  Social History Fay Records, [place]; 12/16/2015 11:14 AM) Alcohol use Occasional alcohol use. Caffeine use Tea. Illicit drug use Uses socially only. Tobacco use Current every day smoker.  Family History Fay Records, [place]; 12/16/2015 11:14 AM) Hypertension Father.    Review of Systems Fay Records CMA;  12/16/2015 11:14 AM) General Not Present- Appetite Loss, Chills, Fatigue, Fever, Night Sweats, Weight Gain and Weight Loss. Skin Not Present- Change in Wart/Mole, Dryness, Hives, Jaundice, New Lesions, Non-Healing Wounds, Rash and Ulcer. HEENT Present- Seasonal Allergies and Wears glasses/contact lenses. Not Present- Earache, Hearing Loss, Hoarseness, Nose Bleed, Oral Ulcers, Ringing in the Ears, Sinus Pain, Sore Throat, Visual Disturbances and Yellow Eyes. Respiratory Not Present- Bloody sputum, Chronic Cough, Difficulty Breathing, Snoring and Wheezing. Breast Not Present- Breast Mass, Breast Pain, Nipple Discharge and Skin Changes. Cardiovascular Not Present- Chest Pain, Difficulty Breathing Lying Down, Leg Cramps, Palpitations, Rapid Heart Rate, Shortness of Breath and Swelling of Extremities. Gastrointestinal Present- Bloody Stool and Rectal Pain. Not Present- Abdominal Pain, Bloating, Change in Bowel Habits, Chronic diarrhea, Constipation, Difficulty Swallowing, Excessive gas, Gets full quickly at meals, Hemorrhoids, Indigestion, Nausea and Vomiting. Male Genitourinary Not Present- Blood in Urine, Change in Urinary Stream, Frequency, Impotence, Nocturia, Painful Urination, Urgency and Urine Leakage. Musculoskeletal Not Present- Back Pain, Joint Pain, Joint Stiffness, Muscle Pain, Muscle Weakness and Swelling of Extremities. Neurological Not Present- Decreased Memory, Fainting, Headaches, Numbness, Seizures, Tingling, Tremor, Trouble walking and Weakness. Psychiatric Not Present- Anxiety, Bipolar, Change in Sleep Pattern, Depression, Fearful and Frequent crying. Endocrine Present- New Diabetes. Not Present- Cold Intolerance, Excessive Hunger, Hair Changes, Heat Intolerance and Hot flashes. Hematology Not Present- Easy Bruising, Excessive bleeding, Gland problems, HIV and Persistent Infections.  Vitals Fay Records CMA; 12/16/2015 11:16 AM) 12/16/2015 11:15 AM Weight: 214 lb Height: 69in Body  Surface Area: 2.13 m Body Mass Index: 31.6 kg/m  Temp.: 98.50F  Pulse: 117 (Irregular)  BP: 126/78 (Sitting, Left Arm, Standard)       Physical Exam (Neisha Hinger PA C; 12/16/2015 12:08 PM) General Mental Status-Alert. General Appearance-Cooperative, Not in  acute distress. Orientation-Oriented X4.  Integumentary General Characteristics Overall examination of the patient's skin reveals - no rashes. Color - normal coloration of skin. Skin Moisture - normal skin moisture.  Head and Neck Head-normocephalic, atraumatic with no lesions or palpable masses.  Chest and Lung Exam Chest and lung exam reveals -quiet, even and easy respiratory effort with no use of accessory muscles.  Rectal Note: He has significant circumferential hemorrhoidal disease. There is bleeding coming from the left anal verge between 2 piles. There appears to be extensive thrombosis. No apparent condylomata.   Neurologic Neurologic evaluation reveals -normal attention span and ability to concentrate and able to name objects and repeat phrases. Appropriate fund of knowledge .  Musculoskeletal Note: Extremities are atraumatic and show a good range of motion. He came in a wheelchair which she says was because it hurts to walk due to his rectal pain.     Assessment & Plan Kevin Yates Carrick Rijos PA C; 12/16/2015 12:10 PM) THROMBOSED HEMORRHOIDS (K64.5) Impression: The patient was examined with Dr. Ezzard Standing. These are extensive hemorrhoids that would not be amenable to thrombectomy in the office. He would likely benefit from admission to the hospital with possible thrombectomy/excision under anesthesia. Tight management of his blood sugars would also be in his best interest, so medicine would be consulted. This was discussed with the patient and his wife who voiced understanding and agreed to proceed. He will be made NPO in case he needs to go to the OR today. BLEEDING HEMORRHOID (K64.9)

## 2015-12-16 NOTE — Progress Notes (Signed)
Inpatient Diabetes Program Recommendations  AACE/ADA: New Consensus Statement on Inpatient Glycemic Control (2015)  Target Ranges:  Prepandial:   less than 140 mg/dL      Peak postprandial:   less than 180 mg/dL (1-2 hours)      Critically ill patients:  140 - 180 mg/dL   Results for CORIN, CUZZORT (MRN [id number]) as of 12/16/2015 14:46  Ref. Range 12/13/2015 15:55  Hemoglobin A1C Latest Ref Range: <5.7 % 15.2 (H)    Admit with: Thrombosed Hemorrhoids  History: New diagnosis of DM (diagnosed 12/14/15)  Home DM Meds: Tresiba (insulin degludec) 10 units QHS       Actos 45 mg daily  Current Insulin Orders: Levemir 20 units daily      Novolog Moderate Correction Scale/ SSI (0-15 units) TID AC + HS     -Note patient just diagnosed with DM at his PCP office on Tuesday (12/14/15).  Was given CBG meter and Tresiba insulin teaching at the PCP office.  -Back to ED with hemorrhoid issues.  Current A1c of 15.2% shows very high glucose levels for the last 3 months.  -Will order educational materials for this patient and have nursing begin basic DM survival skills education with patient at bedside.  -Will attempt to speak with patient either today or tomorrow AM.     --Will follow patient during hospitalization--  Ambrose Finland RN, MSN, CDE Diabetes Coordinator Inpatient Glycemic Control Team Team Pager: [phone] (8a-5p)

## 2015-12-16 NOTE — Progress Notes (Signed)
[facility] Progress Note     Subjective: Admitted for thrombosed hemorrhoids.  C/o significant pain and bleeding from sites.  He says he was just diagnosed with hyperglycemia on Monday and his sugars have been 300-500 over the last few days.  PCP is PA [employer] at [facility].  He has been direct admitted from our office for consideration of surgery of his hemorrhoids.  Objective: Vital signs in last 24 hours: Temp:  [98.4 F (36.9 C)-98.5 F (36.9 C)] 98.5 F (36.9 C) (05/04 1332) Pulse Rate:  [111-116] 111 (05/04 1332) Resp:  [16] 16 (05/04 1332) BP: (138-144)/(80-99) 144/80 mmHg (05/04 1332) SpO2:  [96 %-98 %] 98 % (05/04 1332)    Intake/Output from previous day:   Intake/Output this shift:    PE: Gen:  Alert, NAD, pleasant Rectum:  Significant circumferential hemorrhoids which are edematous, oozing blood, firm in some areas and quite painful.  Thrombosis present.  Lab Results:  No results for input(s): WBC, HGB, HCT, PLT in the last 72 hours. BMET  Recent Labs  12/13/15 1555  NA 125*  K 4.3  CL 90*  CO2 18*  GLUCOSE 586*  BUN 12  CREATININE 1.14  CALCIUM 9.2   PT/INR No results for input(s): LABPROT, INR in the last 72 hours. CMP     Component Value Date/Time   NA 125* 12/13/2015 1555   K 4.3 12/13/2015 1555   CL 90* 12/13/2015 1555   CO2 18* 12/13/2015 1555   GLUCOSE 586* 12/13/2015 1555   BUN 12 12/13/2015 1555   CREATININE 1.14 12/13/2015 1555   CALCIUM 9.2 12/13/2015 1555   PROT 7.0 12/13/2015 1555   ALBUMIN 4.4 12/13/2015 1555   AST 10 12/13/2015 1555   ALT 14 12/13/2015 1555   ALKPHOS 114 12/13/2015 1555   BILITOT 0.6 12/13/2015 1555   GFRNONAA 81 12/13/2015 1555   GFRAA >89 12/13/2015 1555   Lipase  No results found for: LIPASE     Studies/Results: No results found.  Anti-infectives: Anti-infectives    None       Assessment/Plan Thrombosed edematous circumferential hemorrhoids -Senna BID, ice  3x/hour, sitz bath with epsom salts 4x/day, nupercaine ointment 4x/day -Consider proctofoam if not improving -IVF, pain control -Bed rest with bathroom privileges -Hopefully they will resolve with conservative management for now and then elective hemorrhoidectomy at some point.  NPO after midnight in case he doesn't improve overnight.    Uncontrolled Type II DM - A1C 15, blood sugars 586 - Medicine consult - planning insulin drip, diabetes RN consult Hypertension   LOS: 0 days    Nonie Hoyer 12/16/2015, 2:02 PM Pager: [phone]  (7am - 4:30pm M-F; 7am - 11:30am Sa/Su)

## 2015-12-16 NOTE — Consult Note (Signed)
[facility] Medical Consultation  Kevin Yates VFI:[id number] DOB: 04/11/1976 DOA: 12/16/2015 PCP: Frazier Richards, PA-C   Requesting physician: Dr. Derrell Lolling  Date of consultation: 12/16/15 Reason for consultation: diabetes management and assistance with chronic medical problems   Impression/Recommendations 1-Bleeding external hemorrhoids: per primary team -attempting conservative approach and if that failed might need surgical intervention  2-uncontrolled diabetes: with hyperglycemia (CBG 586) -A1C 15.2 -will start treatment with levemir and SSI -follow CBG's and adjust as needed  -modified carb diet -will ask diabetes education service to visit him and instruct further in diet, CBG's check and general teaching points  -holding oral agents for now -will replete electrolytes (especially potassium)  3-hyponatremia and hypokalemia: -due to hyperglycemia and insulin therapy -will replete K and follow sodium level  4-essential HTN -stable and fairly well controlled -will continue current antihypertensive regimen   5-tobacco abuse; -cessation counseling provided  -will add nicotine patch   [facility] will followup again tomorrow. Please contact me if I can be of assistance in the meanwhile. Thank you for this consultation.  Chief Complaint: thrombosed external hemorrhoids  HPI:  Patient is 40 y/o with hx of HTN, tobacco abuse and recent diagnosed DM (uncontrolled); who was admitted secondary to thrombosed external hemorrhoids and concerns for needing surgical intervention. Patient deneis CP, nausea, vomiting, SOB, fever, chills, dysuria or any other complaints. TRH consulted for assistance managing diabetes and assistance with other chronic medical problems   Review of Systems:  Negative except a otherwise mentioned on HPI.  Past Medical History  Diagnosis Date  . Hypertension   . Diabetes mellitus without complication (HCC)    History reviewed. No pertinent past surgical  history.   Social History:  reports that he has been smoking Cigarettes.  He has been smoking about 0.50 packs per day. He has never used smokeless tobacco. He reports that he drinks about 4.8 oz of alcohol per week. He reports that he does not use illicit drugs.  Allergies  Allergen Reactions  . Metformin And Related Diarrhea    After one dose, developed abdominal pain and diarrhea---12/14/2015   Family History  Problem Relation Age of Onset  . Hypertension Mother   . Hypertension Father     Prior to Admission medications   Medication Sig Start Date End Date Taking? Authorizing Provider  docusate sodium (COLACE) 100 MG capsule Take 1 capsule (100 mg total) by mouth every 12 (twelve) hours. 12/15/15  Yes Richardean Canal, MD  hydrochlorothiazide (HYDRODIURIL) 25 MG tablet Take 1 tablet (25 mg total) by mouth daily. 12/13/15  Yes Mary B Dixon, PA-C  hydrocortisone (ANUSOL-HC) 2.5 % rectal cream Apply rectally 2 times daily Patient taking differently: Place 1 application rectally 2 (two) times daily.  12/15/15  Yes Richardean Canal, MD  Insulin Degludec (TRESIBA FLEXTOUCH) 100 UNIT/ML SOPN Inject 10 Units into the skin at bedtime. 12/15/15  Yes Salley Scarlet, MD  metoprolol succinate (TOPROL-XL) 25 MG 24 hr tablet Take 1 tablet (25 mg total) by mouth daily. 12/13/15  Yes Mary B Dixon, PA-C  polyethylene glycol (MIRALAX / GLYCOLAX) packet Take 17 g by mouth daily. 12/15/15  Yes Richardean Canal, MD  pioglitazone (ACTOS) 45 MG tablet Take 1 tablet (45 mg total) by mouth daily. 12/15/15   Dorena Bodo, PA-C   Physical Exam: Blood pressure 144/80, pulse 111, temperature 98.5 F (36.9 C), temperature source Oral, resp. rate 16, SpO2 98 %. Filed Vitals:   12/16/15 1332  BP: 144/80  Pulse: 111  Temp: 98.5 F (36.9 C)  Resp: 16     General:  Afebrile, in mild discomfort due to tenderness in his external hemorrhoids. No CP, no SOB and no nausea or vomiting.  Eyes: PERRL, no icterus, no nystagmus, EOMI  ENT: no  erythema or exudates; good dentition, no drainage out of ears or nostrils  Neck: no JVD, no bruits  Cardiovascular: S1 and S2, mild tachycardia, no rubs or gallops  Respiratory: CTA bilaterally, good air movement  Abdomen: soft, NT, ND, positive BS  Skin: no open wounds, no rash or petechiae. Significant circumferential hemorrhoids which are edematous, slight oozing blood, firm in some areas and quite painful. Thrombosis present.  Musculoskeletal: trace edema, no cyanosis or clubbing   Psychiatric: stable mood, no SI or hallucinations   Neurologic: AAOX3, grossly intact, no focal deficit; muscle strength 5/5 bilaterally  Labs on Admission:  Basic Metabolic Panel:  Recent Labs Lab 12/13/15 1555  NA 125*  K 4.3  CL 90*  CO2 18*  GLUCOSE 586*  BUN 12  CREATININE 1.14  CALCIUM 9.2   Liver Function Tests:  Recent Labs Lab 12/13/15 1555  AST 10  ALT 14  ALKPHOS 114  BILITOT 0.6  PROT 7.0  ALBUMIN 4.4   CBC:  Recent Labs Lab 12/16/15 1403  WBC 6.7  HGB 14.3  HCT 40.1  MCV 77.6*  PLT 228   CBG:  Recent Labs Lab 12/16/15 1712  GLUCAP 360*    Radiological Exams on Admission: No results found.  EKG:  None   Time spent: 50 minutes  Vassie Loll [facility] Pager [phone]  If 7PM-7AM, please contact night-coverage [email or web address] Password [email or web address] 12/16/2015, 2:27 PM

## 2015-12-17 DIAGNOSIS — K648 Other hemorrhoids: Secondary | ICD-10-CM | POA: Diagnosis not present

## 2015-12-17 DIAGNOSIS — E118 Type 2 diabetes mellitus with unspecified complications: Secondary | ICD-10-CM | POA: Diagnosis not present

## 2015-12-17 DIAGNOSIS — Z720 Tobacco use: Secondary | ICD-10-CM | POA: Diagnosis not present

## 2015-12-17 DIAGNOSIS — K645 Perianal venous thrombosis: Secondary | ICD-10-CM | POA: Diagnosis not present

## 2015-12-17 DIAGNOSIS — I10 Essential (primary) hypertension: Secondary | ICD-10-CM | POA: Diagnosis not present

## 2015-12-17 LAB — BASIC METABOLIC PANEL
Anion gap: 12 (ref 5–15)
BUN: 14 mg/dL (ref 6–20)
CALCIUM: 9.4 mg/dL (ref 8.9–10.3)
CHLORIDE: 98 mmol/L — AB (ref 101–111)
CO2: 25 mmol/L (ref 22–32)
CREATININE: 0.96 mg/dL (ref 0.61–1.24)
GFR calc Af Amer: >60 mL/min (ref >60)
GFR calc non Af Amer: >60 mL/min (ref >60)
Glucose, Bld: 270 mg/dL — ABNORMAL HIGH (ref 65–99)
Potassium: 3.9 mmol/L (ref 3.5–5.1)
SODIUM: 135 mmol/L (ref 135–145)

## 2015-12-17 LAB — GLUCOSE, CAPILLARY
GLUCOSE-CAPILLARY: 274 mg/dL — AB (ref 65–99)
GLUCOSE-CAPILLARY: 287 mg/dL — AB (ref 65–99)
Glucose-Capillary: 313 mg/dL — ABNORMAL HIGH (ref 65–99)
Glucose-Capillary: 200 mg/dL — ABNORMAL HIGH (ref 65–99)

## 2015-12-17 LAB — MAGNESIUM: MAGNESIUM: 2 mg/dL (ref 1.7–2.4)

## 2015-12-17 MED ORDER — HYDROCORTISONE ACE-PRAMOXINE 1-1 % RE FOAM: 1.0000 | Freq: Four times a day (QID) | RECTAL | Status: DC

## 2015-12-17 MED ORDER — HYDROCORTISONE ACE-PRAMOXINE 1-1 % RE FOAM
1.0000 | Freq: Four times a day (QID) | RECTAL | Status: DC
  Administered 2015-12-17 (×3): 1 via RECTAL
  Filled 2015-12-17: qty 10

## 2015-12-17 MED ORDER — HYDRALAZINE HCL 20 MG/ML IJ SOLN: 10.0000 mg | Freq: Three times a day (TID) | INTRAMUSCULAR | Status: DC | PRN

## 2015-12-17 MED ORDER — METOPROLOL SUCCINATE ER 25 MG PO TB24
25.0000 mg | ORAL_TABLET | Freq: Every day | ORAL | Status: DC
  Administered 2015-12-17 – 2015-12-19 (×3): 25 mg via ORAL
  Filled 2015-12-17 (×3): qty 1

## 2015-12-17 MED ORDER — SENNOSIDES-DOCUSATE SODIUM 8.6-50 MG PO TABS
2.0000 | ORAL_TABLET | Freq: Two times a day (BID) | ORAL | Status: DC
  Administered 2015-12-17 – 2015-12-19 (×4): 2 via ORAL
  Filled 2015-12-17 (×6): qty 2

## 2015-12-17 MED ORDER — KETOROLAC TROMETHAMINE 30 MG/ML IJ SOLN
30.0000 mg | Freq: Four times a day (QID) | INTRAMUSCULAR | Status: DC
  Administered 2015-12-17 – 2015-12-19 (×6): 30 mg via INTRAVENOUS
  Filled 2015-12-17 (×14): qty 1

## 2015-12-17 MED ORDER — HEPARIN SODIUM (PORCINE) 5000 UNIT/ML IJ SOLN
5000.0000 [IU] | Freq: Three times a day (TID) | INTRAMUSCULAR | Status: AC
  Filled 2015-12-17 (×3): qty 1

## 2015-12-17 MED ORDER — DIBUCAINE 1 % EX OINT
TOPICAL_OINTMENT | Freq: Four times a day (QID) | CUTANEOUS | Status: DC
  Administered 2015-12-17 – 2015-12-18 (×4): via TOPICAL
  Administered 2015-12-18: 1 via TOPICAL
  Administered 2015-12-18: 07:00:00 via TOPICAL
  Administered 2015-12-19: 1 via TOPICAL
  Administered 2015-12-19: 13:00:00 via TOPICAL

## 2015-12-17 NOTE — Progress Notes (Signed)
  Subjective: Hemorrhoids still very inflamed and swollen.  He has some stool on them that has not been cleaned.  I told him he could eat we would keep working on medical management for now.  Objective: Vital signs in last 24 hours: Temp:  [97.9 F (36.6 C)-98.5 F (36.9 C)] 98.3 F (36.8 C) (05/05 0608) Pulse Rate:  [107-117] 107 (05/05 0608) Resp:  [16-17] 17 (05/05 0608) BP: (137-152)/(76-102) 137/76 mmHg (05/05 0608) SpO2:  [93 %-98 %] 94 % (05/05 0608) Weight:  [97 kg (213 lb 13.5 oz)] 97 kg (213 lb 13.5 oz) (05/04 1534) Last BM Date: 12/16/15  PO 240  voided x 5 Bm x 1 Afebrile, BP better controlled, still tachycardic this AM Pain 6-7  Labs:  Glucose down to 270-300+ range Triglycerides 2221    Intake/Output from previous day: 05/04 0701 - 05/05 0700 In: 240 [P.O.:240] Out: -  Intake/Output this shift:    General appearance: alert, cooperative and no distress Skin: Skin color, texture, turgor normal. No rashes or lesions or hemorrhoids, inflamed and very swollen, tender, stool on the sites   Lab Results:   Recent Labs  12/16/15 1403  WBC 6.7  HGB 14.3  HCT 40.1  PLT 228    BMET  Recent Labs  12/16/15 1403 12/17/15 0520  NA 130* 135  K 3.4* 3.9  CL 92* 98*  CO2 20* 25  GLUCOSE 346* 270*  BUN 16 14  CREATININE 1.06 0.96  CALCIUM 9.8 9.4   PT/INR No results for input(s): LABPROT, INR in the last 72 hours.   Recent Labs Lab 12/13/15 1555  AST 10  ALT 14  ALKPHOS 114  BILITOT 0.6  PROT 7.0  ALBUMIN 4.4     Lipase  No results found for: LIPASE   Studies/Results: No results found.  Medications: . dibucaine   Topical QID  . insulin aspart  0-15 Units Subcutaneous TID WC  . insulin aspart  0-5 Units Subcutaneous QHS  . insulin detemir  30 Units Subcutaneous QHS  . insulin starter kit- pen needles  1 kit Other Once  . magnesium sulfate  100 g Other QID  . metoprolol succinate  25 mg Oral Daily  . nicotine  14 mg Transdermal Daily   . senna-docusate  1 tablet Oral BID    Assessment/Plan Bleeding external hemorrhoids Uncontrolled AODM, A1C 15.2 Hypertension Tobacco use Hyponatremia/hypokalemia Dyslipidemia with triglycerides 2221/total cholesterol 349  FEN:  IV fluids/Carb mod diet/  NPO after MN tonight ID:  No antibiotics VTE:  SCD/heparin, hold after MN tonight  Plan:  I am going to let him eat, shower and get stool off hemorrhoids.  Add proctofoam and alter the time for the dibucaine ointment so we double up on treatment of his hemorrhoids, I will make him NPO after MN again and let the weekend crew decide if he needs surgery.  CBC BMP in AM.    LOS: 1 day    Carden Teel 12/17/2015 [phone]

## 2015-12-17 NOTE — Progress Notes (Signed)
[facility] PROGRESS NOTE  Kevin Yates NOM:[id number] DOB: Jul 31, 1976 DOA: 12/16/2015 PCP: Karis Juba, PA-C  Interim summary and brief HPI 40 y/o with hx of HTN, tobacco abuse and recent diagnosed DM (uncontrolled); who was admitted secondary to thrombosed external hemorrhoids and concerns for needing surgical intervention. Patient deneis CP, nausea, vomiting, SOB, fever, chills, dysuria or any other complaints. [facility] consulted for assistance managing diabetes and assistance with other chronic medical problems  Pain Continue be unbearable and continue to have bleeding/oozing from hemorrhoids. Diabetes/CBG's better and electrolytes stable. Will continue to follow along and provide further medication adjustments as needed.   Assessment/Plan: 1-Bleeding external hemorrhoids: per primary team -attempting conservative approach and if that failed might need surgical intervention  2-uncontrolled diabetes: with hyperglycemia (CBG 586) -A1C 15.2 -will continue treatment with levemir and SSI -follow CBG's and adjust as needed  -modified carb diet when taking PO's -diabetes education service to visit him and instruct further in diet, CBG's check and general teaching points  -holding oral agents for now -will replete electrolytes (especially potassium) as needed -at discharge will require insulin therapy as main treatment and once improvement on his A1C CBG achieved, then adjustment and changes to oral regimen can be attempted.  3-hyponatremia and hypokalemia: -due to hyperglycemia, diuretics use and insulin therapy -K repleted, Mg WNL -sodium is now 135 after sugar better controlled -will monitor trend  4-Essential HTN -stable and fairly well controlled -will continue toprol -holding HCTZ for now -PRN hydralazine ordered   5-tobacco abuse; -cessation counseling provided  -continue nicotine patch if needed  Code Status: Full Code Family Communication: no family at bedside   Disposition Plan: per primary service. Will anticipate discharge back home when stable.  Procedures:  None   Antibiotics:  None   HPI/Subjective: Still with significant pain from external hemorrhoids and Sitz baths has make the pain worse. Denies CP, SOB, abd pain, fever or any other complaints.  Objective: Filed Vitals:   12/16/15 2137 12/17/15 0608  BP: 152/102 137/76  Pulse: 117 107  Temp: 97.9 F (36.6 C) 98.3 F (36.8 C)  Resp: 16 17    Intake/Output Summary (Last 24 hours) at 12/17/15 0724 Last data filed at 12/16/15 1700  Gross per 24 hour  Intake    240 ml  Output      0 ml  Net    240 ml   Filed Weights   12/16/15 1534  Weight: 97 kg (213 lb 13.5 oz)    Exam:   General:  Complaining of significant pain in his hemorrhoids; endorses that the [employer] makes it worse. Continue oozing. No fever and otherwise w/o complaints.  Cardiovascular: sinus rhythm, no rubs, no gallops, no murmurs   Respiratory: CTA bilaterally; good air movement   Abdomen: soft, NT, ND, positive BS  Musculoskeletal: trace edema bilaterally, no cyanosis or clubbing   Data Reviewed: Basic Metabolic Panel:  Recent Labs Lab 12/13/15 1555 12/16/15 1403 12/17/15 0520  NA 125* 130* 135  K 4.3 3.4* 3.9  CL 90* 92* 98*  CO2 18* 20* 25  GLUCOSE 586* 346* 270*  BUN _0 CREATININE 1.14 1.06 0.96  CALCIUM 9.2 9.8 9.4  MG  --   --  2.0    GFR: Estimated Creatinine Clearance: 119.7 mL/min (by C-G formula based on Cr of 0.96).  Liver Function Tests:  Recent Labs Lab 12/13/15 1555  AST 10  ALT 14  ALKPHOS 114  BILITOT 0.6  PROT 7.0  ALBUMIN 4.4  CBC:  Recent Labs Lab 12/16/15 1403  WBC 6.7  HGB 14.3  HCT 40.1  MCV 77.6*  PLT 228   CBG:  Recent Labs Lab 12/16/15 1712 12/16/15 2111  GLUCAP 360* 348*    Studies: No results found.  Scheduled Meds: . dibucaine   Topical QID  . insulin aspart  0-15 Units Subcutaneous TID WC  . insulin aspart   0-5 Units Subcutaneous QHS  . insulin detemir  30 Units Subcutaneous QHS  . insulin starter kit- pen needles  1 kit Other Once  . magnesium sulfate  100 g Other QID  . nicotine  14 mg Transdermal Daily  . senna-docusate  1 tablet Oral BID   Continuous Infusions: . lactated ringers 100 mL/hr at 12/17/15 4859    Time spent: 25 minutes    Barton Dubois  [facility] Pager [phone]. If 7PM-7AM, please contact night-coverage at [email or web address], password [facility] 12/17/2015, 7:24 AM  LOS: 1 day

## 2015-12-17 NOTE — Plan of Care (Signed)
Problem: Food- and Nutrition-Related Knowledge Deficit (NB-1.1) Goal: Nutrition education Formal process to instruct or train a patient/client in a skill or to impart knowledge to help patients/clients voluntarily manage or modify food choices and eating behavior to maintain or improve health.  Outcome: Completed/Met Date Met:  12/17/15  RD consulted for nutrition education regarding diabetes.     Lab Results  Component Value Date    HGBA1C 15.2* 12/13/2015    RD provided "Carbohydrate Counting for People with Diabetes" handout from the Academy of Nutrition and Dietetics. Discussed different food groups and their effects on blood sugar, emphasizing carbohydrate-containing foods. Provided list of carbohydrates and recommended serving sizes of common foods.  Discussed importance of controlled and consistent carbohydrate intake throughout the day. Provided examples of ways to balance meals/snacks and encouraged intake of high-fiber, whole grain complex carbohydrates. Teach back method used.  Expect fair compliance.  Body mass index is 31.14 kg/(m^2). Pt meets criteria for obese based on current BMI.  Current diet order is carb modified, patient is consuming approximately 100% of meals at this time. Labs and medications reviewed. No further nutrition interventions warranted at this time. RD contact information provided. If additional nutrition issues arise, please re-consult RD.  Kevin Yates. Cristan Hout, MS, RD LDN After Hours/Weekend Pager [phone]

## 2015-12-17 NOTE — Progress Notes (Signed)
Inpatient Diabetes Program Recommendations  AACE/ADA: New Consensus Statement on Inpatient Glycemic Control (2015)  Target Ranges:  Prepandial:   less than 140 mg/dL      Peak postprandial:   less than 180 mg/dL (1-2 hours)      Critically ill patients:  140 - 180 mg/dL   Results for Kevin Yates, Kevin Yates (MRN [id number]) as of 12/17/2015 10:50  Ref. Range 12/16/2015 17:12 12/16/2015 21:11 12/17/2015 08:00  Glucose-Capillary Latest Ref Range: 65-99 mg/dL 360 (H) 348 (H) 313 (H)   Results for [name], [name] (MRN [id number]) as of 12/17/2015 10:50  Ref. Range 12/13/2015 15:55  Hemoglobin A1C Latest Ref Range: <5.7 % 15.2 (H)    Admit with: Thrombosed Hemorrhoids  History: New diagnosis of DM (diagnosed 12/14/15)  Home DM Meds: Tresiba (insulin degludec) 10 units QHS  Actos 45 mg daily  Current Insulin Orders: Levemir 30 units QHS  Novolog Moderate Correction Scale/ SSI (0-15 units) TID AC + HS     -Note patient just diagnosed with DM at his PCP office on Tuesday (12/14/15). Was given CBG meter and Tresiba insulin teaching at the PCP office.  -Back to ED with hemorrhoid issues. Current A1c of 15.2% shows very high glucose levels for the last 3 months.  -Spoke with pt about new diagnosis.  Discussed A1C results with him and explained what an A1C is, basic pathophysiology of DM Type 2, basic home care, basic diabetes diet nutrition principles, importance of checking CBGs and maintaining good CBG control to prevent long-term and short-term complications.  Reviewed signs and symptoms of hyperglycemia and hypoglycemia and how to treat hypoglycemia at home.  Also reviewed blood sugar goals at home.    -RNs to provide ongoing basic DM education at bedside with this patient.  Have ordered educational booklet, insulin starter kit, and DM videos.  Have also placed RD consult for DM diet education for this patient.  -Reviewed verbally how to use  insulin pen at home with patient.  Patient stated he gave himself two injections of insulin with his insulin pen prior to being admitted.  Reminded patient to never store the insulin pen with the needle attached and to always prime the insulin pen with 2 units before every injection.  Also reviewed the contents of the insulin pen teaching kit with patient, reviewed the Living Well with DM booklet, and encouraged patient to watch the DM videos with his wife when he feels up to it.  -Will Order Outpatient DM Edu classes for this patient as well to the [place] [facility].     --Will follow patient during hospitalization--  Wyn Quaker RN, MSN, CDE Diabetes Coordinator Inpatient Glycemic Control Team Team Pager: [phone] (8a-5p)

## 2015-12-17 NOTE — Progress Notes (Signed)
Patient given sitz bath overnight. After sitz bath, external hemorrhoids seemed to have more bleeding. Patient states that sitz bath seemed to make the pain worse. Patient describes pain as throbbing. Patient refused morning sitz bath. Will continue to monitor.

## 2015-12-18 DIAGNOSIS — Z720 Tobacco use: Secondary | ICD-10-CM | POA: Diagnosis not present

## 2015-12-18 DIAGNOSIS — I10 Essential (primary) hypertension: Secondary | ICD-10-CM | POA: Diagnosis not present

## 2015-12-18 DIAGNOSIS — E1165 Type 2 diabetes mellitus with hyperglycemia: Secondary | ICD-10-CM | POA: Diagnosis not present

## 2015-12-18 DIAGNOSIS — K645 Perianal venous thrombosis: Secondary | ICD-10-CM | POA: Diagnosis not present

## 2015-12-18 DIAGNOSIS — K648 Other hemorrhoids: Secondary | ICD-10-CM | POA: Diagnosis not present

## 2015-12-18 LAB — CBC
HEMATOCRIT: 36.2 % — AB (ref 39.0–52.0)
HEMATOCRIT: 36.2 % — AB (ref 39.0–52.0)
HEMOGLOBIN: 12.1 g/dL — AB (ref 13.0–17.0)
HEMOGLOBIN: 11.9 g/dL — AB (ref 13.0–17.0)
MCH: 26.4 pg (ref 26.0–34.0)
MCH: 26.8 pg (ref 26.0–34.0)
MCHC: 32.9 g/dL (ref 30.0–36.0)
MCHC: 33.4 g/dL (ref 30.0–36.0)
MCV: 80.3 fL (ref 78.0–100.0)
MCV: 80.4 fL (ref 78.0–100.0)
Platelets: 200 10*3/uL (ref 150–400)
Platelets: 222 10*3/uL (ref 150–400)
RBC: 4.5 MIL/uL (ref 4.22–5.81)
RBC: 4.51 MIL/uL (ref 4.22–5.81)
RDW: 14.9 % (ref 11.5–15.5)
RDW: 15 % (ref 11.5–15.5)
WBC: 3.6 10*3/uL — ABNORMAL LOW (ref 4.0–10.5)
WBC: 5 10*3/uL (ref 4.0–10.5)

## 2015-12-18 LAB — BASIC METABOLIC PANEL
ANION GAP: 10 (ref 5–15)
BUN: 13 mg/dL (ref 6–20)
CALCIUM: 9 mg/dL (ref 8.9–10.3)
CO2: 25 mmol/L (ref 22–32)
Chloride: 104 mmol/L (ref 101–111)
Creatinine, Ser: 0.83 mg/dL (ref 0.61–1.24)
GFR calc Af Amer: >60 mL/min (ref >60)
Glucose, Bld: 250 mg/dL — ABNORMAL HIGH (ref 65–99)
Potassium: 3.8 mmol/L (ref 3.5–5.1)
Sodium: 139 mmol/L (ref 135–145)

## 2015-12-18 LAB — GLUCOSE, CAPILLARY
GLUCOSE-CAPILLARY: 287 mg/dL — AB (ref 65–99)
GLUCOSE-CAPILLARY: 271 mg/dL — AB (ref 65–99)
GLUCOSE-CAPILLARY: 194 mg/dL — AB (ref 65–99)
Glucose-Capillary: 251 mg/dL — ABNORMAL HIGH (ref 65–99)

## 2015-12-18 MED ORDER — WITCH HAZEL-GLYCERIN EX PADS
MEDICATED_PAD | Freq: Four times a day (QID) | CUTANEOUS | Status: DC
  Administered 2015-12-18 (×2): via TOPICAL
  Administered 2015-12-18: 1 via TOPICAL
  Administered 2015-12-18 – 2015-12-19 (×2): via TOPICAL
  Filled 2015-12-18: qty 100

## 2015-12-18 MED ORDER — HYDROCORTISONE 2.5 % RE CREA
TOPICAL_CREAM | Freq: Four times a day (QID) | RECTAL | Status: DC
  Administered 2015-12-18 (×3): via RECTAL
  Administered 2015-12-18: 1 via RECTAL
  Administered 2015-12-19: 11:00:00 via RECTAL
  Filled 2015-12-18: qty 28.35

## 2015-12-18 MED ORDER — INSULIN ASPART 100 UNIT/ML ~~LOC~~ SOLN
0.0000 [IU] | Freq: Three times a day (TID) | SUBCUTANEOUS | Status: DC
  Administered 2015-12-18 – 2015-12-19 (×4): 11 [IU] via SUBCUTANEOUS

## 2015-12-18 MED ORDER — MAGNESIUM HYDROXIDE 400 MG/5ML PO SUSP
30.0000 mL | Freq: Every day | ORAL | Status: DC
  Administered 2015-12-18 – 2015-12-19 (×2): 30 mL via ORAL
  Filled 2015-12-18 (×2): qty 30

## 2015-12-18 MED ORDER — INSULIN ASPART 100 UNIT/ML ~~LOC~~ SOLN: 0.0000 [IU] | Freq: Every day | SUBCUTANEOUS | Status: DC

## 2015-12-18 MED ORDER — INSULIN DETEMIR 100 UNIT/ML ~~LOC~~ SOLN
40.0000 [IU] | Freq: Every day | SUBCUTANEOUS | Status: DC
  Administered 2015-12-18: 40 [IU] via SUBCUTANEOUS
  Filled 2015-12-18: qty 0.4

## 2015-12-18 NOTE — Progress Notes (Signed)
[facility] PROGRESS NOTE  Kevin Yates WCH:[id number] DOB: 1976-08-08 DOA: 12/16/2015 PCP: Frazier Richards, PA-C  Interim summary and brief HPI 40 y/o with hx of HTN, tobacco abuse and recent diagnosed DM (uncontrolled); who was admitted secondary to thrombosed external hemorrhoids and concerns for needing surgical intervention. Patient deneis CP, nausea, vomiting, SOB, fever, chills, dysuria or any other complaints. [facility] consulted for assistance managing diabetes and assistance with other chronic medical problems  Pain Continue be unbearable and continue to have bleeding/oozing from hemorrhoids. Diabetes/CBG's better, even still elevated. Electrolytes stable and WNL now. Will continue to follow along and provide further medication adjustments as needed.   Assessment/Plan: 1-Bleeding external hemorrhoids: per primary team -attempting conservative approach and if that failed might need surgical intervention  2-uncontrolled diabetes: with hyperglycemia (CBG 586 on admission) -A1C 15.2 -will continue treatment with levemir (adjusted to 40 units) and SSI (changed to resistant)  -follow CBG's and adjust further as needed  -modified carb diet when taking PO's -diabetes education service to visit him and instruct further in diet, CBG's check and general teaching points  -holding oral agents for now -at discharge will require insulin therapy as main treatment and once improvement on his A1C CBG achieved, then adjustment and changes to oral regimen can be attempted.  3-hyponatremia and hypokalemia: -due to hyperglycemia, diuretics use and insulin therapy -K repleted, Mg WNL -sodium is now 139 after sugar is better controlled and IVF's were given -will monitor trend  4-Essential HTN -Overall stable and fairly well controlled -will continue toprol -holding HCTZ for now -PRN hydralazine ordered   5-tobacco abuse; -cessation counseling provided  -continue nicotine patch if  needed  Code Status: Full Code Family Communication: no family at bedside  Disposition Plan: per primary service. Will anticipate discharge back home when stable.  Procedures:  None   Antibiotics:  None   HPI/Subjective: Still with significant pain from external hemorrhoids and experiencing oozing/bleeding. Denies CP, SOB, abd pain, fever or any other complaints. CBG's still elevated (between 250-280 now)  Objective: Filed Vitals:   12/17/15 2038 12/18/15 0544  BP: 147/88 150/95  Pulse: 102 100  Temp: 98.3 F (36.8 C) 98.3 F (36.8 C)  Resp: 16 16    Intake/Output Summary (Last 24 hours) at 12/18/15 1152 Last data filed at 12/17/15 1241  Gross per 24 hour  Intake    440 ml  Output      0 ml  Net    440 ml   Filed Weights   12/16/15 1534  Weight: 97 kg (213 lb 13.5 oz)    Exam:   General: Still Complaining of significant pain in his rectum/anal region from the hemorrhoids; Continue having oozing/bleeding. No fever and otherwise w/o complaints.  Cardiovascular: sinus rhythm, no rubs, no gallops, no murmurs   Respiratory: CTA bilaterally; good air movement   Abdomen: soft, NT, ND, positive BS  Musculoskeletal: trace edema bilaterally, no cyanosis or clubbing   Data Reviewed: Basic Metabolic Panel:  Recent Labs Lab 12/13/15 1555 12/16/15 1403 12/17/15 0520 12/18/15 0722  NA 125* 130* 135 139  K 4.3 3.4* 3.9 3.8  CL 90* 92* 98* 104  CO2 18* 20* 25 25  GLUCOSE 586* 346* 270* 250*  BUN 12 16 14 13   CREATININE 1.14 1.06 0.96 0.83  CALCIUM 9.2 9.8 9.4 9.0  MG  --   --  2.0  --     GFR: Estimated Creatinine Clearance: 138.4 mL/min (by C-G formula based on Cr of 0.83).  Liver  Function Tests:  Recent Labs Lab 12/13/15 1555  AST 10  ALT 14  ALKPHOS 114  BILITOT 0.6  PROT 7.0  ALBUMIN 4.4   CBC:  Recent Labs Lab 12/16/15 1403 12/18/15 0007 12/18/15 0722  WBC 6.7 5.0 3.6*  HGB 14.3 12.1* 11.9*  HCT 40.1 36.2* 36.2*  MCV 77.6* 80.3  80.4  PLT 228 222 200   CBG:  Recent Labs Lab 12/17/15 1201 12/17/15 1659 12/17/15 2243 12/18/15 0750 12/18/15 1140  GLUCAP 200* 274* 287* 251* 287*    Studies: No results found.  Scheduled Meds: . dibucaine   Topical QID  . heparin subcutaneous  5,000 Units Subcutaneous Q8H  . hydrocortisone   Rectal QID  . insulin aspart  0-20 Units Subcutaneous TID WC  . insulin aspart  0-5 Units Subcutaneous QHS  . insulin detemir  40 Units Subcutaneous QHS  . ketorolac  30 mg Intravenous Q6H  . magnesium hydroxide  30 mL Oral Daily  . magnesium sulfate  100 g Other QID  . metoprolol succinate  25 mg Oral Daily  . nicotine  14 mg Transdermal Daily  . senna-docusate  2 tablet Oral BID  . witch hazel-glycerin   Topical QID   Continuous Infusions:    Time spent: 25 minutes    Vassie Loll  [facility] Pager [phone]. If 7PM-7AM, please contact night-coverage at [email or web address], password [facility] 12/18/2015, 11:52 AM  LOS: 2 days

## 2015-12-18 NOTE — Progress Notes (Signed)
  Subjective: Still with discomfort but seem a little better.  Objective: Vital signs in last 24 hours: Temp:  [98.3 F (36.8 C)-98.4 F (36.9 C)] 98.3 F (36.8 C) (05/06 0544) Pulse Rate:  [100-109] 100 (05/06 0544) Resp:  [16] 16 (05/06 0544) BP: (139-150)/(81-95) 150/95 mmHg (05/06 0544) SpO2:  [94 %-99 %] 99 % (05/06 0544) Last BM Date: 12/17/15  Intake/Output from previous day: 05/05 0701 - 05/06 0700 In: 440 [P.O.:440] Out: -  Intake/Output this shift:    PE: General- In NAD Anorectal-diffuse perianal swelling R>L with no necrosis or gangrene  Lab Results:   Recent Labs  12/18/15 0007 12/18/15 0722  WBC 5.0 3.6*  HGB 12.1* 11.9*  HCT 36.2* 36.2*  PLT 222 200   BMET  Recent Labs  12/17/15 0520 12/18/15 0722  NA 135 139  K 3.9 3.8  CL 98* 104  CO2 25 25  GLUCOSE 270* 250*  BUN 14 13  CREATININE 0.96 0.83  CALCIUM 9.4 9.0   PT/INR No results for input(s): LABPROT, INR in the last 72 hours. Comprehensive Metabolic Panel:    Component Value Date/Time   NA 139 12/18/2015 0722   NA 135 12/17/2015 0520   K 3.8 12/18/2015 0722   K 3.9 12/17/2015 0520   CL 104 12/18/2015 0722   CL 98* 12/17/2015 0520   CO2 25 12/18/2015 0722   CO2 25 12/17/2015 0520   BUN 13 12/18/2015 0722   BUN 14 12/17/2015 0520   CREATININE 0.83 12/18/2015 0722   CREATININE 0.96 12/17/2015 0520   CREATININE 1.14 12/13/2015 1555   GLUCOSE 250* 12/18/2015 0722   GLUCOSE 270* 12/17/2015 0520   CALCIUM 9.0 12/18/2015 0722   CALCIUM 9.4 12/17/2015 0520   AST 10 12/13/2015 1555   ALT 14 12/13/2015 1555   ALKPHOS 114 12/13/2015 1555   BILITOT 0.6 12/13/2015 1555   PROT 7.0 12/13/2015 1555   ALBUMIN 4.4 12/13/2015 1555     Studies/Results: No results found.  Anti-infectives: Anti-infectives    None      Assessment Thrombosed external hemorrhoids involving entire perianal area-surgery at this time would involve significant loss of perianal skin with high risk of  anal stenosis  DM-remains poorly controlled.    LOS: 2 days   Plan: Add TUCKS with witch hazel.  Change to anusol HC topical cream.  Carb modified diet.  Needs better glucose control (per IM).  QID sitz bath.  Ambulate.   Joycelyn Liska J 12/18/2015

## 2015-12-19 DIAGNOSIS — E1165 Type 2 diabetes mellitus with hyperglycemia: Secondary | ICD-10-CM | POA: Diagnosis not present

## 2015-12-19 DIAGNOSIS — K645 Perianal venous thrombosis: Secondary | ICD-10-CM | POA: Diagnosis not present

## 2015-12-19 DIAGNOSIS — Z794 Long term (current) use of insulin: Secondary | ICD-10-CM | POA: Diagnosis not present

## 2015-12-19 DIAGNOSIS — K648 Other hemorrhoids: Secondary | ICD-10-CM | POA: Diagnosis not present

## 2015-12-19 LAB — GLUCOSE, CAPILLARY
GLUCOSE-CAPILLARY: 252 mg/dL — AB (ref 65–99)
GLUCOSE-CAPILLARY: 275 mg/dL — AB (ref 65–99)

## 2015-12-19 MED ORDER — INSULIN DETEMIR 100 UNIT/ML ~~LOC~~ SOLN: 40.0000 [IU] | Freq: Every day | SUBCUTANEOUS | Status: DC

## 2015-12-19 MED ORDER — OXYCODONE-ACETAMINOPHEN 5-325 MG PO TABS: 1.0000 | ORAL_TABLET | ORAL | Status: DC | PRN

## 2015-12-19 MED ORDER — HYDROCORTISONE 2.5 % RE CREA: TOPICAL_CREAM | Freq: Four times a day (QID) | RECTAL | Status: DC

## 2015-12-19 MED ORDER — WITCH HAZEL-GLYCERIN EX PADS: MEDICATED_PAD | Freq: Four times a day (QID) | CUTANEOUS | Status: DC

## 2015-12-19 NOTE — Discharge Summary (Signed)
Physician Discharge Summary  Patient ID: Kevin Yates MRN: [id number] DOB/AGE: 1976/07/22 40 y.o.  Admit date: 12/16/2015 Discharge date: 12/19/2015  Admission Diagnoses:  Diffuse, external thrombosed hemorrhoids  Discharge Diagnoses:  Active Problems:   Diabetes mellitus type II, uncontrolled (HCC)      Discharged Condition: Improved  Hospital Course: He was admitted and noted to have poorly controlled blood sugars as well as significant perianal swelling consistent with thrombosed hemorrhoids. Operative management would've sacrificed a lot of perianal skin and increase the risk of anal stenosis. He was started on an aggressive regimen for thrombosed hemorrhoids and slowly improved. Internal medicine consultation was obtained and started him on Levemir.  This brought his blood sugars under better control. He was recently diagnosed with diabetes and has been treated by his PCP.  On the day of discharge, the hemorrhoidal swelling was decreasing and he is feeling much better. He was discharged to home with specific care instructions, appropriate medications for his hemorrhoids and Levemir for his DM.  He was instructed to call his primary care physician and be seen later this week to have his blood sugars checked. He was given discharge instructions regarding his hemorrhoid care and was told to call the office and arranged to see Dr. Derrell Lolling in 1-2 weeks.  Consults: internal medicine  Discharge Exam: Blood pressure 145/93, pulse 99, temperature 97.9 F (36.6 C), temperature source Oral, resp. rate 18, height 5' 9.5" (1.765 m), weight 97 kg (213 lb 13.5 oz), SpO2 98 %.  Disposition: 01-Home or Self Care  Discharge Instructions    Ambulatory referral to Nutrition and Diabetic Education    Complete by:  As directed   New diagnosis of DM at PCP office this week.  Current A1c= 15.2%.  To d/c home on insulin.  Thanks!            Medication List    STOP taking these medications        Insulin Degludec 100 UNIT/ML Sopn  Commonly known as:  TRESIBA FLEXTOUCH      TAKE these medications        docusate sodium 100 MG capsule  Commonly known as:  COLACE  Take 1 capsule (100 mg total) by mouth every 12 (twelve) hours.     hydrochlorothiazide 25 MG tablet  Commonly known as:  HYDRODIURIL  Take 1 tablet (25 mg total) by mouth daily.     hydrocortisone 2.5 % rectal cream  Commonly known as:  ANUSOL-HC  Place rectally 4 (four) times daily.     insulin detemir 100 UNIT/ML injection  Commonly known as:  LEVEMIR  Inject 0.4 mLs (40 Units total) into the skin at bedtime.     metoprolol succinate 25 MG 24 hr tablet  Commonly known as:  TOPROL-XL  Take 1 tablet (25 mg total) by mouth daily.     oxyCODONE-acetaminophen 5-325 MG tablet  Commonly known as:  PERCOCET/ROXICET  Take 1-2 tablets by mouth every 4 (four) hours as needed for moderate pain or severe pain.     pioglitazone 45 MG tablet  Commonly known as:  ACTOS  Take 1 tablet (45 mg total) by mouth daily.     polyethylene glycol packet  Commonly known as:  MIRALAX / GLYCOLAX  Take 17 g by mouth daily.     witch hazel-glycerin pad  Commonly known as:  TUCKS  Apply topically 4 (four) times daily.         Signed: Adolph Pollack 12/19/2015, 2:08 PM

## 2015-12-19 NOTE — Progress Notes (Signed)
[facility] PROGRESS NOTE  Kevin Yates RUE:[id number] DOB: 06-08-1976 DOA: 12/16/2015 PCP: Allayne Butcher [name], PA-C  Assessment/Plan: 1-Bleeding external hemorrhoids: per primary team -attempting conservative approach and will follow up with Dr. Derrell Lolling in 1-2 weeks  2-uncontrolled diabetes: with hyperglycemia (CBG 586 on admission) -A1C 15.2 -will discharge on levemir (adjusted to 40 units) and Actos  -modified carb diet encouraged -diabetes education visited him and provide instructions about diet, CBG's check and general teaching points  -will need further adjustment in his insulin therapy and potentially combination of novolog TID around meals. -close follow up with PCP for further adjustments in his hypoglycemic regimen is imperative   3-hyponatremia and hypokalemia: -due to hyperglycemia, diuretics use and insulin therapy -K repleted, Mg WNL -sodium is now 139 after sugar is better controlled  -will recommend BMET to reassess electrolytes during follow up visit.  4-Essential HTN -Overall stable and fairly well controlled -will recommend resumption of home antihypertensive regimen at discharge  5-tobacco abuse; -cessation counseling provided   Code Status: Full Code Family Communication: no family at bedside  Disposition Plan: per primary service. Will anticipate discharge back home when stable. Will need close follow up with PCP for further adjustment on hypoglycemic regimen.  Procedures:  None   Antibiotics:  None   HPI/Subjective: Still with pain from external hemorrhoids and experiencing some oozing/bleeding; but much improved and able to move his bowels w/o problem and ambulating w/o to much difficulty. Denies CP, SOB, abd pain, fever or any other complaints. CBG's still elevated (between 200-250's range)  Objective: Filed Vitals:   12/18/15 2235 12/19/15 0619  BP: 139/98 145/93  Pulse: 94 99  Temp: 98.4 F (36.9 C) 97.9 F (36.6 C)  Resp: 19 18     Intake/Output Summary (Last 24 hours) at 12/19/15 1307 Last data filed at 12/19/15 1478  Gross per 24 hour  Intake    600 ml  Output      0 ml  Net    600 ml   Filed Weights   12/16/15 1534  Weight: 97 kg (213 lb 13.5 oz)    Exam:   General: Still Complaining of pain in his rectum/anal region from the hemorrhoids; but improved. Less oozing/bleeding. No fever and otherwise w/o complaints. CBG's significantly improved, even still high.  Cardiovascular: sinus rhythm, no rubs, no gallops, no murmurs   Respiratory: CTA bilaterally; good air movement   Abdomen: soft, NT, ND, positive BS  Musculoskeletal: trace edema bilaterally, no cyanosis or clubbing   Data Reviewed: Basic Metabolic Panel:  Recent Labs Lab 12/13/15 1555 12/16/15 1403 12/17/15 0520 12/18/15 0722  NA 125* 130* 135 139  K 4.3 3.4* 3.9 3.8  CL 90* 92* 98* 104  CO2 18* 20* 25 25  GLUCOSE 586* 346* 270* 250*  BUN CREATININE 1.14 1.06 0.96 0.83  CALCIUM 9.2 9.8 9.4 9.0  MG  --   --  2.0  --     GFR: Estimated Creatinine Clearance: 138.4 mL/min (by C-G formula based on Cr of 0.83).  Liver Function Tests:  Recent Labs Lab 12/13/15 1555  AST 10  ALT 14  ALKPHOS 114  BILITOT 0.6  PROT 7.0  ALBUMIN 4.4   CBC:  Recent Labs Lab 12/16/15 1403 12/18/15 0007 12/18/15 0722  WBC 6.7 5.0 3.6*  HGB 14.3 12.1* 11.9*  HCT 40.1 36.2* 36.2*  MCV 77.6* 80.3 80.4  PLT 228 222 200   CBG:  Recent Labs Lab 12/18/15 1140 12/18/15 1631 12/18/15  2231 12/19/15 0722 12/19/15 1157  GLUCAP 287* 271* 194* 252* 275*    Studies: No results found.  Scheduled Meds: . dibucaine   Topical QID  . hydrocortisone   Rectal QID  . insulin aspart  0-20 Units Subcutaneous TID WC  . insulin aspart  0-5 Units Subcutaneous QHS  . insulin detemir  40 Units Subcutaneous QHS  . ketorolac  30 mg Intravenous Q6H  . magnesium hydroxide  30 mL Oral Daily  . magnesium sulfate  100 g Other QID  .  metoprolol succinate  25 mg Oral Daily  . nicotine  14 mg Transdermal Daily  . senna-docusate  2 tablet Oral BID  . witch hazel-glycerin   Topical QID   Continuous Infusions:    Time spent: 25 minutes    Vassie Loll  [facility] Pager [phone]. If 7PM-7AM, please contact night-coverage at [email or web address], password [facility] 12/19/2015, 1:07 PM  LOS: 3 days

## 2015-12-19 NOTE — Progress Notes (Signed)
  Subjective: Feels much better.  Walking.  Bowels moving.  Objective: Vital signs in last 24 hours: Temp:  [97.3 F (36.3 C)-98.4 F (36.9 C)] 97.9 F (36.6 C) (05/07 0619) Pulse Rate:  [94-110] 99 (05/07 0619) Resp:  [18-20] 18 (05/07 0619) BP: (139-145)/(71-98) 145/93 mmHg (05/07 0619) SpO2:  [96 %-98 %] 98 % (05/07 0619) Last BM Date: 12/17/15  Intake/Output from previous day: 05/06 0701 - 05/07 0700 In: 960 [P.O.:960] Out: -  Intake/Output this shift:    PE: General- In NAD Anorectal-diffuse perianal swelling R>L with no necrosis or gangrene, slightly decreased today.  Lab Results:   Recent Labs  12/18/15 0007 12/18/15 0722  WBC 5.0 3.6*  HGB 12.1* 11.9*  HCT 36.2* 36.2*  PLT 222 200   BMET  Recent Labs  12/17/15 0520 12/18/15 0722  NA 135 139  K 3.9 3.8  CL 98* 104  CO2 25 25  GLUCOSE 270* 250*  BUN 14 13  CREATININE 0.96 0.83  CALCIUM 9.4 9.0   PT/INR No results for input(s): LABPROT, INR in the last 72 hours. Comprehensive Metabolic Panel:    Component Value Date/Time   NA 139 12/18/2015 0722   NA 135 12/17/2015 0520   K 3.8 12/18/2015 0722   K 3.9 12/17/2015 0520   CL 104 12/18/2015 0722   CL 98* 12/17/2015 0520   CO2 25 12/18/2015 0722   CO2 25 12/17/2015 0520   BUN 13 12/18/2015 0722   BUN 14 12/17/2015 0520   CREATININE 0.83 12/18/2015 0722   CREATININE 0.96 12/17/2015 0520   CREATININE 1.14 12/13/2015 1555   GLUCOSE 250* 12/18/2015 0722   GLUCOSE 270* 12/17/2015 0520   CALCIUM 9.0 12/18/2015 0722   CALCIUM 9.4 12/17/2015 0520   AST 10 12/13/2015 1555   ALT 14 12/13/2015 1555   ALKPHOS 114 12/13/2015 1555   BILITOT 0.6 12/13/2015 1555   PROT 7.0 12/13/2015 1555   ALBUMIN 4.4 12/13/2015 1555     Studies/Results: No results found.  Anti-infectives: Anti-infectives    None      Assessment Thrombosed external hemorrhoids involving entire perianal area-improved over past 24 hours; surgery at this time would involve  significant loss of perianal skin with high risk of anal stenosis  DM-better control    LOS: 3 days   Plan:  Discharge today on Levemir and oral agent.  Follow up with PCP later this week to continue working on DM regimen.  Continue current regimen for thrombosed hemorrhoids and follow up with Dr. Derrell Lolling in 1-2 weeks.   Kevin Yates J 12/19/2015

## 2015-12-19 NOTE — Discharge Instructions (Signed)
Check blood sugar before each meal and before going to bed.  If less than 80, drink a glass of orange juice and call your PCP.  Avoid sugar in foods and drinks.  Low carbohydrate diet.  No lifting over 20 pounds.  Wear a pad in your underwear.  Warm water sitz bath for 15 minutes four times a day.  Over the counter stool softener twice a day.  Milk of Magnesia twice a day as needed for constipation.  Call our office tomorrow to make an appointment to see Dr. Derrell Lolling [phone]) in 1-2 weeks.  Call also for any problems like we discussed.  See your PCP to have then check on your diabetes and adjust your medicines as needed.

## 2015-12-19 NOTE — Progress Notes (Signed)
Pt left with spouse. Alert, oriented, and without c/o. Discharge instructions/prescriptions given/explained with pt verbalizing understanding. Followup appointments noted. Pt left with hemorrhoid supplies.

## 2015-12-24 ENCOUNTER — Other Ambulatory Visit: Payer: Self-pay | Admitting: *Deleted

## 2015-12-24 MED ORDER — BLOOD GLUCOSE SYSTEM PAK KIT
PACK | Status: DC
Start: 2015-12-24 — End: 2017-12-18

## 2015-12-24 MED ORDER — LANCET DEVICES MISC: Status: DC

## 2015-12-24 MED ORDER — BLOOD GLUCOSE TEST VI STRP: ORAL_STRIP | Status: DC

## 2015-12-24 MED ORDER — LANCETS MISC: Status: DC

## 2015-12-24 NOTE — Telephone Encounter (Signed)
Received call from patient requesting refill on DM supplies.   Prescription sent to pharmacy.

## 2015-12-27 ENCOUNTER — Encounter: Payer: Self-pay | Admitting: Physician Assistant

## 2015-12-27 ENCOUNTER — Ambulatory Visit (INDEPENDENT_AMBULATORY_CARE_PROVIDER_SITE_OTHER): Payer: BLUE CROSS/BLUE SHIELD | Admitting: Physician Assistant

## 2015-12-27 VITALS — BP 128/82 | HR 88 | Temp 98.0°F | Resp 18 | Wt 209.0 lb

## 2015-12-27 DIAGNOSIS — I10 Essential (primary) hypertension: Secondary | ICD-10-CM

## 2015-12-27 DIAGNOSIS — K648 Other hemorrhoids: Secondary | ICD-10-CM

## 2015-12-27 DIAGNOSIS — Z794 Long term (current) use of insulin: Secondary | ICD-10-CM

## 2015-12-27 DIAGNOSIS — L989 Disorder of the skin and subcutaneous tissue, unspecified: Secondary | ICD-10-CM | POA: Diagnosis not present

## 2015-12-27 DIAGNOSIS — K644 Residual hemorrhoidal skin tags: Secondary | ICD-10-CM

## 2015-12-27 DIAGNOSIS — E1165 Type 2 diabetes mellitus with hyperglycemia: Secondary | ICD-10-CM | POA: Diagnosis not present

## 2015-12-27 MED ORDER — LISINOPRIL 10 MG PO TABS: 10.0000 mg | ORAL_TABLET | Freq: Every day | ORAL | Status: DC

## 2015-12-28 NOTE — Progress Notes (Addendum)
Patient ID: Kevin Yates MRN: [id number], DOB: 03-31-1976 40 y.o. Date of Encounter: 12/28/2015, 11:50 AM    Chief Complaint:  "swelling around anal region"  HPI: 40 y.o. y/oAA male here for above.   12/13/2015:  Presents for CPE. His wife is also here with him for OV today.  Pt reports they have lived in [place] for about 3 years.  Says he had insurance, then was without insurance, now with insurance again.   Says "blood pressure always borderline."  Says "took BP med before moved to [facility]"  Recently, has not seen any medical provider on routine basis. Just U/C as needed for acute illness etc.  Past few weeks, his "mouth is constantly dry" and "urinating frequently."  Smokes about 8-10 cigarettes per day (about 1/2 ppd). For about 20 years. Never more than current amount. About 1/2 ppd for about 20 years.   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  No other complaints/concerns.  No other PMH. Has had no surgeries.  Family History reviewed today. Both mom and dad with HTN. No Diabetes. No family h/o premature CAD or CA.   Labs Performed 12/13/2015 significant for:  Glucose-- 586      HgbA1C--- 15.2  He was then scheduled f/u OV here with Dr. Buelah Manis 12/14/2014.  At that visit, he was taught how to check BS, give Insulin, etc. Was started on Insulin 10 units daily and Metformin 526m BID.   --12/15/2015: He reports that he took his first dose of Metformin last night.  States that last night, during the night, he had bad abdominal pains and lots of diarrhea.  Says that he does not usually have problems with diarrhea or loose stools.  Says stools are "usually normal".   Pt reports that "anal area seems swollen now".  Discussed that it may be secondary to irritation from diarrhea.  Wife says she wants me to look at it--says she looked at it at home, but "isn't used to looking at that area and doesn't know what it should look like".  I then examined pt---see "Physical Exam" below.  Even  after this, I asked pt, repeatedly, if he had noticed any mass or irritation in his anal region before today---and he repeatedly says that he just noticed / just developed this problem today.  Says he usually has "normal stools" with no difficulty with that either.   At that visit---had pt go directly to ER. He subsequently was evaluated by General Surgery. Initially, was admitted to hospital to get sugar controlled and have surgery of Hemorrhoids.  However, I then received note that Surgeon was concerned that may not be the best approach and no surgery was performed.  12/27/2015: Today pt reports he was kept in hospital 5 days. Says he has appt to f/u with Surgeon Wednesday to decide plan.  I reviewed Discharge Summary: Hospitalized 12/16/15- 12/19/15. "Operatie management would have sacrificed a lot of perianal skin and increased risk of anal stenosis" He received a lot of diabetic education while in hte hospitla. Says he has lots of booklets and brochures to read.  Says he is taking actos 416mQ AM and Levemir 40 units QHS. Brings BS log--has 2 fasting readings--241, 265.  2 readings from 8pm--277,372.  I then explained how to titrate up insulin by 2 units unitl fasting sugars at goal. He seemed a little confused---I had nurse go in later and make sure he knew how to dial up to correct units----It turned out he has been giving 4 units  instead of 40.  He is to go home and give 40 units, as they were doing in the hospital and f/u if BS too high or too low.   He says "Carbs was his favorite food group". Says he was eating a lot of sweets and a lot of junk food. A lot of sweet tea and cookies.  Also says he quit smoking.  "Hasn't had a cigarette since he came here"---yay!!  Today he does have one additional concern to address. Has area on back of left calf---says in past it was "a bump there"--it was removed and biopsied. Says after that, this "hard bump developed at that spot and this dark rash  developed around it"  ADDENDUM---ADDED 02/16/2016: I received note from [facility] [facility]  ---- Dr. Marcial Pacas--- noted --"patient has swollen optic nerves. He needs referral to neurology. I will follow his nerve swelling."  They made appointment for patient to be seen a [facility] on 02/16/16 9 AM.    Review of Systems: Consitutional: No fever, chills, fatigue, night sweats, lymphadenopathy, or weight changes. Eyes: No visual changes, eye redness, or discharge. ENT/Mouth: Ears: No otalgia, tinnitus, hearing loss, discharge. Nose: No congestion, rhinorrhea, sinus pain, or epistaxis. Throat: No sore throat, post nasal drip, or teeth pain. Cardiovascular: No CP, palpitations, diaphoresis, DOE, edema, orthopnea, PND. Respiratory: No cough, hemoptysis, SOB, or wheezing. Gastrointestinal: No anorexia, dysphagia, reflux, pain, nausea, vomiting, hematemesis, constipation, BRBPR, or melena. Genitourinary: No dysuria, frequency, urgency, hematuria, incontinence, nocturia, decreased urinary stream, discharge, impotence, or testicular pain/masses. Musculoskeletal: No decreased ROM, myalgias, stiffness, joint swelling, or weakness. Skin: No rash, erythema, lesion changes, pain, warmth, jaundice, or pruritis. Neurological: No headache, dizziness, syncope, seizures, tremors, memory loss, coordination problems, or paresthesias. Psychological: No anxiety, depression, hallucinations, SI/HI. Endocrine: No fatigue,  polyphagia, or known diabetes. All other systems were reviewed and are otherwise negative.  Past Medical History  Diagnosis Date  . Hypertension   . Diabetes mellitus without complication ([place])      History reviewed. No pertinent past surgical history.  Home Meds:  Outpatient Prescriptions Prior to Visit  Medication Sig Dispense Refill  . Blood Glucose Monitoring Suppl (BLOOD GLUCOSE SYSTEM PAK) KIT Please dispense based on patient and insurance preference. Use as directed to  monitor FSBS 3x daily. Dx: E11.65. 1 each 1  . docusate sodium (COLACE) 100 MG capsule Take 1 capsule (100 mg total) by mouth every 12 (twelve) hours. 30 capsule 0  . Glucose Blood (BLOOD GLUCOSE TEST STRIPS) STRP Please dispense based on patient and insurance preference. Use as directed to monitor FSBS 3x daily. Dx: E11.65. 100 each 11  . hydrocortisone (ANUSOL-HC) 2.5 % rectal cream Place rectally 4 (four) times daily. 30 g 2  . insulin detemir (LEVEMIR) 100 UNIT/ML injection Inject 0.4 mLs (40 Units total) into the skin at bedtime. 10 mL 11  . Lancet Devices MISC Please dispense based on patient and insurance preference. Use as directed to monitor FSBS 3x daily. Dx: E11.65. 1 each 1  . metoprolol succinate (TOPROL-XL) 25 MG 24 hr tablet Take 1 tablet (25 mg total) by mouth daily. 30 tablet 0  . pioglitazone (ACTOS) 45 MG tablet Take 1 tablet (45 mg total) by mouth daily. 30 tablet 3  . polyethylene glycol (MIRALAX / GLYCOLAX) packet Take 17 g by mouth daily. 14 each 0  . witch hazel-glycerin (TUCKS) pad Apply topically 4 (four) times daily. 40 each 5  . hydrochlorothiazide (HYDRODIURIL) 25 MG tablet Take 1 tablet (  25 mg total) by mouth daily. 30 tablet 0  . oxyCODONE-acetaminophen (PERCOCET/ROXICET) 5-325 MG tablet Take 1-2 tablets by mouth every 4 (four) hours as needed for moderate pain or severe pain. (Patient not taking: Reported on 12/27/2015) 30 tablet 0  . Lancets MISC Please dispense based on patient and insurance preference. Use as directed to monitor FSBS 3x daily. Dx: E11.65. 100 each 11   No facility-administered medications prior to visit.    Allergies:  Allergies  Allergen Reactions  . Metformin And Related Diarrhea    After one dose, developed abdominal pain and diarrhea---12/14/2015    Social History   Social History  . Marital Status: Unknown    Spouse Name: N/A  . Number of Children: N/A  . Years of Education: N/A   Occupational History  . Not on file.   Social  History Main Topics  . Smoking status: Current Every Day Smoker -- 0.50 packs/day    Types: Cigarettes  . Smokeless tobacco: Never Used  . Alcohol Use: 4.8 oz/week    8 Standard drinks or equivalent per week     Comment: mostly on weekend  . Drug Use: No  . Sexual Activity: Yes   Other Topics Concern  . Not on file   Social History Narrative    Family History  Problem Relation Age of Onset  . Hypertension Mother   . Hypertension Father     Physical Exam: Blood pressure 128/82, pulse 88, temperature 98 F (36.7 C), temperature source Oral, resp. rate 18, weight 209 lb (94.802 kg).  General: Well developed, well nourished,AAM. Appears in no acute distress. Neck: Supple. Trachea midline. No thyromegaly. Full ROM. No lymphadenopathy.No carotid bruits. Lungs: Clear to auscultation bilaterally without wheezes, rales, or rhonchi. Breathing is of normal effort and unlabored. Cardiovascular: RRR with S1 S2. No murmurs, rubs, or gallops. Distal pulses 2+ symmetrically. No carotid or abdominal bruits. Musculoskeletal: Full range of motion and 5/5 strength throughout.  Skin: Warm and moist. Posterior aspect of Left Calf: Approx 1 inch vertical x 1/2 inch horizontal area of hyperpigmentation. Splotchy borders. Unraised. In center of this, is ~ 1cm diameter firm nodule.  Neuro: A+Ox3. CN II-XII grossly intact. Moves all extremities spontaneously. Full sensation throughout. Normal gait.  Psych:  Responds to questions appropriately with a normal affect.   Assessment/Plan:  40 y.o. y/o AA male here for   1. Uncontrolled type 2 diabetes mellitus with hyperglycemia, with long-term current use of insulin ([place]) He is intolerant to Metformin--GI adv effects (May re-try this in future once hemorrhoid gone, but definitley not until then) On Actos 68m QD.  On Levemir 40 units QHS Hold regarding aspirin until hemorrhoid has been managed. Stop HCTZ.  Start Lisinopril 140mQD  So he is on ACE Inh  given Diabetes.  Congrats on Smoking Cessation!! On no Statin----FLP showed Triglycerides 2221, LDL noncalculable. Waiting to get BS down then recheck FLP to determine statin dose.   - lisinopril (PRINIVIL,ZESTRIL) 10 MG tablet; Take 1 tablet (10 mg total) by mouth daily.  Dispense: 30 tablet; Refill: 0  Will perform Diabetic Foot Exam at future OV   ADDENDUM---ADDED 02/16/2016: I received note from [facility]---- Dr. AnMarcial Pacas- noted --"patient has swollen optic nerves. He needs referral to neurology. I will follow his nerve swelling."  They made appointment for patient to be seen a [facility] on 02/16/16 9 AM.   2. Hypertension Given Diabetes, Needs ACE Inhibitor.  Stop HCTZ Start Lisinopril Return  in 2 weeks to recheck BP and BMET.  - lisinopril (PRINIVIL,ZESTRIL) 10 MG tablet; Take 1 tablet (10 mg total) by mouth daily.  Dispense: 30 tablet; Refill: 0  3. Bleeding external hemorrhoids ---Per General Surgery ADDENDUM ADDED 12/30/2015: Received OV note from [facility]--Dr. Fanny Skates dated 12/29/15.  "swollen hemorrhoids have improved a great deal since recent hospitalization. You do not need surgery. The bleeding should stop and the swelling will go down. Continue to take stool softener twice a day and avoid constipation.  Continue to use hydrocortisone cream twice a day.  F/U there PRN  4. Skin lesion Will Refer to Dermatology - Ambulatory referral to Dermatology   F/U OV 2 weeks.  Sooner if needed.  THE FOLLOWING IS COPIED FROM OV NOTE 12/13/2015:   -1. Encounter to establish care  2. Visit for preventive health examination A. Screening Labs: He is not fasting but cna return fasting in morning for FLP. Wife wants [place] to go ahead and check other labs now and check FLP tomorrow.  - COMPLETE METABOLIC PANEL WITH GFR - TSH - VITAMIN D 25 Hydroxy (Vit-D Deficiency, Fractures) - Hemoglobin A1c - Lipid panel; Future  B. Screening For  Prostate Cancer: Given he is African American, will start at age 75  C. Screening For Colorectal Cancer:  Will start at age 19  D. Immunizations: Flu---------------------------N/A Tetanus---------------------He reports he received tetanus vaccine around 2011--says he was shot in thigh-- Pneumococcal-----------He reports he has never had a pneumonia vaccine. Given he is a smoker, needs Pneumovax 23. He is agreeable.     Given here 12/13/2015. Zostavax---------------------Will discuss at age 22  3. Essential hypertension I checked BP myself in each arm. Get about 170-180/110 bilaterally.  A little tachycardic, but rhythm regular.  So, will use Toprol and will use HCTZ (African American male) Will check lab He will schedule f/u OV 2 weeks to recheck BMET and BP on meds. - COMPLETE METABOLIC PANEL WITH GFR - metoprolol succinate (TOPROL-XL) 25 MG 24 hr tablet; Take 1 tablet (25 mg total) by mouth daily.  Dispense: 30 tablet; Refill: 0 - hydrochlorothiazide (HYDRODIURIL) 25 MG tablet; Take 1 tablet (25 mg total) by mouth daily.  Dispense: 30 tablet; Refill: 0  4. Polyuria Check A1C and BMET glucose - Hemoglobin A1c  5. Polydipsia Check A1C and BMET glucose - Hemoglobin A1c  6. Urinary frequency - Urinalysis, Routine w reflex microscopic (not at [facility])  7. Smoker Will discuss cessation at future f/u visit.  8. Need for prophylactic vaccination against Streptococcus pneumoniae (pneumococcus) - Pneumococcal polysaccharide vaccine 23-valent greater than or equal to 2yo subcutaneous/IM  F/U OV 2 weeks to check BMET and BP.  Signed:   [address] [place], [place]  12/28/2015 11:50 AM

## 2016-01-06 ENCOUNTER — Telehealth: Payer: Self-pay | Admitting: Physician Assistant

## 2016-01-06 MED ORDER — PEN NEEDLES 31G X 6 MM MISC: 1.0000 | Freq: Every day | Status: DC

## 2016-01-06 MED ORDER — INSULIN DEGLUDEC 100 UNIT/ML ~~LOC~~ SOPN: 50.0000 [IU] | PEN_INJECTOR | Freq: Every day | SUBCUTANEOUS | Status: DC

## 2016-01-06 NOTE — Telephone Encounter (Signed)
Patient calling to get his insulin called into [facility] [address], pharmacy told him to call us because it has not been filled there at that pharmacy  [phone] (H)

## 2016-01-06 NOTE — Telephone Encounter (Signed)
Medication refilled per protocol. 

## 2016-01-07 ENCOUNTER — Telehealth: Payer: Self-pay | Admitting: Family Medicine

## 2016-01-07 MED ORDER — INSULIN DETEMIR 100 UNIT/ML FLEXPEN: 50.0000 [IU] | PEN_INJECTOR | Freq: Every day | SUBCUTANEOUS | Status: DC

## 2016-01-07 NOTE — Telephone Encounter (Signed)
Pt was started on Insulin.  Was given Tresiba samples from office.  Pt was needing more so RX for Evaristo Bury was sent to pharmacy.  Insurance wants pt to have tried or failed Lantus, Levimer or Toujeo.  Called pt told need to go back to Levimer even though he has never used.  I gave him discount card for [place].  He is going to check with pharmacy.  If still can use card and have low co-pay he will stay with [place].  Told him to call and let me know.

## 2016-01-07 NOTE — Telephone Encounter (Signed)
Levemir rewritten for insurance purposes and pt given discount card for that.

## 2016-01-11 NOTE — Telephone Encounter (Signed)
Thanks for taking care of that. Approved.

## 2016-01-18 ENCOUNTER — Encounter: Payer: Self-pay | Admitting: Physician Assistant

## 2016-01-21 ENCOUNTER — Other Ambulatory Visit: Payer: Self-pay | Admitting: Physician Assistant

## 2016-01-24 ENCOUNTER — Other Ambulatory Visit: Payer: Self-pay | Admitting: Physician Assistant

## 2016-01-24 NOTE — Telephone Encounter (Signed)
Medication refilled per protocol. 

## 2016-01-25 ENCOUNTER — Encounter: Payer: Self-pay | Admitting: Family Medicine

## 2016-01-25 NOTE — Telephone Encounter (Signed)
Medication refill for one time only.  Patient needs to be seen.  Letter sent for patient to call and schedule 

## 2016-02-14 LAB — HM DIABETES EYE EXAM

## 2016-02-16 ENCOUNTER — Encounter: Payer: Self-pay | Admitting: Family Medicine

## 2016-02-16 ENCOUNTER — Ambulatory Visit: Payer: BLUE CROSS/BLUE SHIELD | Admitting: Neurology

## 2016-02-16 ENCOUNTER — Telehealth: Payer: Self-pay | Admitting: *Deleted

## 2016-02-16 NOTE — Telephone Encounter (Signed)
No showed new patient appointment. 

## 2016-02-17 ENCOUNTER — Encounter: Payer: Self-pay | Admitting: Neurology

## 2016-03-21 ENCOUNTER — Encounter: Payer: Self-pay | Admitting: Family Medicine

## 2016-04-15 ENCOUNTER — Encounter: Payer: Self-pay | Admitting: Physician Assistant

## 2016-04-24 ENCOUNTER — Other Ambulatory Visit: Payer: Self-pay | Admitting: Physician Assistant

## 2016-04-24 DIAGNOSIS — IMO0001 Reserved for inherently not codable concepts without codable children: Secondary | ICD-10-CM

## 2016-04-24 DIAGNOSIS — E1165 Type 2 diabetes mellitus with hyperglycemia: Principal | ICD-10-CM

## 2016-04-24 NOTE — Telephone Encounter (Signed)
Prescription sent to pharmacy.

## 2016-05-22 ENCOUNTER — Encounter: Payer: Self-pay | Admitting: Physician Assistant

## 2016-09-13 ENCOUNTER — Other Ambulatory Visit: Payer: Self-pay | Admitting: Physician Assistant

## 2016-09-13 DIAGNOSIS — E1165 Type 2 diabetes mellitus with hyperglycemia: Principal | ICD-10-CM

## 2016-09-13 DIAGNOSIS — IMO0001 Reserved for inherently not codable concepts without codable children: Secondary | ICD-10-CM

## 2017-02-08 ENCOUNTER — Ambulatory Visit (INDEPENDENT_AMBULATORY_CARE_PROVIDER_SITE_OTHER): Payer: BLUE CROSS/BLUE SHIELD | Admitting: Physician Assistant

## 2017-02-08 ENCOUNTER — Encounter: Payer: Self-pay | Admitting: Physician Assistant

## 2017-02-08 VITALS — BP 140/99 | HR 108 | Temp 98.0°F | Resp 18 | Ht 69.0 in | Wt 238.0 lb

## 2017-02-08 DIAGNOSIS — Z794 Long term (current) use of insulin: Secondary | ICD-10-CM

## 2017-02-08 DIAGNOSIS — Z0000 Encounter for general adult medical examination without abnormal findings: Secondary | ICD-10-CM | POA: Diagnosis not present

## 2017-02-08 DIAGNOSIS — E119 Type 2 diabetes mellitus without complications: Secondary | ICD-10-CM | POA: Diagnosis not present

## 2017-02-08 DIAGNOSIS — I10 Essential (primary) hypertension: Secondary | ICD-10-CM | POA: Diagnosis not present

## 2017-02-08 MED ORDER — METOPROLOL SUCCINATE ER 25 MG PO TB24: ORAL_TABLET | ORAL | 2 refills | Status: DC

## 2017-02-08 MED ORDER — LISINOPRIL 10 MG PO TABS: 10.0000 mg | ORAL_TABLET | Freq: Every day | ORAL | 2 refills | Status: DC

## 2017-02-08 NOTE — Progress Notes (Signed)
Patient ID: Kevin Yates MRN: [id number], DOB: 12/06/1975 41 y.o. Date of Encounter: 02/08/2017, 8:47 AM    Chief Complaint:  "swelling around anal region"  HPI: 41 y.o. y/oAA male here for above.   12/13/2015:  Presents for CPE. His wife is also here with him for OV today.  Pt reports they have lived in [place] for about 3 years.  Says he had insurance, then was without insurance, now with insurance again.   Says "blood pressure always borderline."  Says "took BP med before moved to [facility]"  Recently, has not seen any medical provider on routine basis. Just U/C as needed for acute illness etc.  Past few weeks, his "mouth is constantly dry" and "urinating frequently."  Smokes about 8-10 cigarettes per day (about 1/2 ppd). For about 20 years. Never more than current amount. About 1/2 ppd for about 20 years.   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  No other complaints/concerns.  No other PMH. Has had no surgeries.  Family History reviewed today. Both mom and dad with HTN. No Diabetes. No family h/o premature CAD or CA.   Labs Performed 12/13/2015 significant for:  Glucose-- 586      HgbA1C--- 15.2  He was then scheduled f/u OV here with Dr. Buelah Manis 12/14/2014.  At that visit, he was taught how to check BS, give Insulin, etc. Was started on Insulin 10 units daily and Metformin 569m BID.   --12/15/2015: He reports that he took his first dose of Metformin last night.  States that last night, during the night, he had bad abdominal pains and lots of diarrhea.  Says that he does not usually have problems with diarrhea or loose stools.  Says stools are "usually normal".   Pt reports that "anal area seems swollen now".  Discussed that it may be secondary to irritation from diarrhea.  Wife says she wants me to look at it--says she looked at it at home, but "isn't used to looking at that area and doesn't know what it should look like".  I then examined pt---see "Physical Exam" below.  Even after  this, I asked pt, repeatedly, if he had noticed any mass or irritation in his anal region before today---and he repeatedly says that he just noticed / just developed this problem today.  Says he usually has "normal stools" with no difficulty with that either.   At that visit---had pt go directly to ER. He subsequently was evaluated by General Surgery. Initially, was admitted to hospital to get sugar controlled and have surgery of Hemorrhoids.  However, I then received note that Surgeon was concerned that may not be the best approach and no surgery was performed.  12/27/2015: Today pt reports he was kept in hospital 5 days. Says he has appt to f/u with Surgeon Wednesday to decide plan.  I reviewed Discharge Summary: Hospitalized 12/16/15- 12/19/15. "Operatie management would have sacrificed a lot of perianal skin and increased risk of anal stenosis" He received a lot of diabetic education while in hte hospitla. Says he has lots of booklets and brochures to read.  Says he is taking actos 42mQ AM and Levemir 40 units QHS. Brings BS log--has 2 fasting readings--241, 265.  2 readings from 8pm--277,372.  I then explained how to titrate up insulin by 2 units unitl fasting sugars at goal. He seemed a little confused---I had nurse go in later and make sure he knew how to dial up to correct units----It turned out he has been giving 4 units  instead of 40.  He is to go home and give 40 units, as they were doing in the hospital and f/u if BS too high or too low.   He says "Carbs was his favorite food group". Says he was eating a lot of sweets and a lot of junk food. A lot of sweet tea and cookies.  Also says he quit smoking.  "Hasn't had a cigarette since he came here"---yay!!  Today he does have one additional concern to address. Has area on back of left calf---says in past it was "a bump there"--it was removed and biopsied. Says after that, this "hard bump developed at that spot and this dark rash developed  around it"  ADDENDUM---ADDED 02/16/2016: I received note from [facility]  ---- Dr. Marcial Pacas--- noted --"patient has swollen optic nerves. He needs referral to neurology. I will follow his nerve swelling."  They made appointment for patient to be seen a [facility] on 02/16/16 9 AM.   02/08/2017: Today patient is on the schedule as a complete physical exam. Today his wife accompanies him for visit. Today when I go in the room I asked where he has been for the past year and he responds "I've been sitting right down the road at my house"--- wife then adds that she "had to drag him in here" Staff noted to me that when reviewing medication list he reported that he has not recently been taking the metoprolol or the lisinopril. I reviewed this with him and he verifies this is the case. Has no real reason why he has discontinued it. He states that he is doing 50 units of Levemir every night and he is taking the Actos 45 mg every day. Repeatedly says that he has been compliant with taking both of these on a daily basis and has not been skipping them etc. However these 2 are the only medications he is taking. I also asked about the smoking. He says that he started back smoking and is smoking about one half pack per day. At end of visit wife asked if I can give them some information regarding the foods that he should be eating. I told her that I am out of time today but that we will go over this at follow-up office visit in 2 weeks.(I know that he was given a lot of this information when his diet and diabetes was diagnosed both at the hospital and by me but they both act like they have never heard any information before) He has no specific concerns to address today. Neither one of them brought up any further issues that need to be addressed.  Review of Systems: Consitutional: No fever, chills, fatigue, night sweats, lymphadenopathy, or weight changes. Eyes: No visual changes, eye redness, or  discharge. ENT/Mouth: Ears: No otalgia, tinnitus, hearing loss, discharge. Nose: No congestion, rhinorrhea, sinus pain, or epistaxis. Throat: No sore throat, post nasal drip, or teeth pain. Cardiovascular: No CP, palpitations, diaphoresis, DOE, edema, orthopnea, PND. Respiratory: No cough, hemoptysis, SOB, or wheezing. Gastrointestinal: No anorexia, dysphagia, reflux, pain, nausea, vomiting, hematemesis, constipation, BRBPR, or melena. Genitourinary: No dysuria, frequency, urgency, hematuria, incontinence, nocturia, decreased urinary stream, discharge, impotence, or testicular pain/masses. Musculoskeletal: No decreased ROM, myalgias, stiffness, joint swelling, or weakness. Skin: No rash, erythema, lesion changes, pain, warmth, jaundice, or pruritis. Neurological: No headache, dizziness, syncope, seizures, tremors, memory loss, coordination problems, or paresthesias. Psychological: No anxiety, depression, hallucinations, SI/HI. Endocrine: Known diabetes. All other systems were reviewed and are  otherwise negative.  Past Medical History:  Diagnosis Date  . Diabetes mellitus without complication ([place])   . Hypertension      No past surgical history on file.  Home Meds:  Outpatient Medications Prior to Visit  Medication Sig Dispense Refill  . BAYER MICROLET LANCETS lancets   10  . Blood Glucose Monitoring Suppl (BLOOD GLUCOSE SYSTEM PAK) KIT Please dispense based on patient and insurance preference. Use as directed to monitor FSBS 3x daily. Dx: E11.65. 1 each 1  . Glucose Blood (BLOOD GLUCOSE TEST STRIPS) STRP Please dispense based on patient and insurance preference. Use as directed to monitor FSBS 3x daily. Dx: E11.65. 100 each 11  . Insulin Detemir (LEVEMIR) 100 UNIT/ML Pen Inject 50 Units into the skin daily at 10 pm. 18 mL 11  . Insulin Pen Needle (PEN NEEDLES) 31G X 6 MM MISC 1 each by Does not apply route daily. 100 each 3  . Lancet Devices MISC Please dispense based on patient and  insurance preference. Use as directed to monitor FSBS 3x daily. Dx: E11.65. 1 each 1  . pioglitazone (ACTOS) 45 MG tablet TAKE 1 TABLET BY MOUTH EVERY DAY 30 tablet 3  . docusate sodium (COLACE) 100 MG capsule Take 1 capsule (100 mg total) by mouth every 12 (twelve) hours. 30 capsule 0  . hydrocortisone (ANUSOL-HC) 2.5 % rectal cream Place rectally 4 (four) times daily. 30 g 2  . lisinopril (PRINIVIL,ZESTRIL) 10 MG tablet TAKE 1 TABLET BY MOUTH EVERY DAY 30 tablet 2  . metoprolol succinate (TOPROL-XL) 25 MG 24 hr tablet TAKE 1 TABLET (25 MG TOTAL) BY MOUTH DAILY. 30 tablet 0  . oxyCODONE-acetaminophen (PERCOCET/ROXICET) 5-325 MG tablet Take 1-2 tablets by mouth every 4 (four) hours as needed for moderate pain or severe pain. (Patient not taking: Reported on 12/27/2015) 30 tablet 0  . polyethylene glycol (MIRALAX / GLYCOLAX) packet Take 17 g by mouth daily. 14 each 0  . witch hazel-glycerin (TUCKS) pad Apply topically 4 (four) times daily. 40 each 5   No facility-administered medications prior to visit.     Allergies:  Allergies  Allergen Reactions  . Metformin And Related Diarrhea    After one dose, developed abdominal pain and diarrhea---12/14/2015    Social History   Social History  . Marital status: Married    Spouse name: N/A  . Number of children: N/A  . Years of education: N/A   Occupational History  . Not on file.   Social History Main Topics  . Smoking status: Current Every Day Smoker    Packs/day: 0.50    Types: Cigarettes  . Smokeless tobacco: Never Used  . Alcohol use 4.8 oz/week    8 Standard drinks or equivalent per week     Comment: mostly on weekend  . Drug use: No  . Sexual activity: Yes   Other Topics Concern  . Not on file   Social History Narrative  . No narrative on file    Family History  Problem Relation Age of Onset  . Hypertension Mother   . Hypertension Father     Physical Exam: Blood pressure (!) 140/99, pulse (!) 108, temperature 98 F  (36.7 C), temperature source Oral, resp. rate 18, height '5\' 9"'  (1.753 m), weight 238 lb (108 kg), SpO2 98 %.  General: Well developed, well nourished,AAM. Appears in no acute distress. Neck: Supple. Trachea midline. No thyromegaly. Full ROM. No lymphadenopathy.No carotid bruits. Lungs: Clear to auscultation bilaterally without wheezes, rales, or rhonchi.  Breathing is of normal effort and unlabored. Cardiovascular: RRR with S1 S2. No murmurs, rubs, or gallops. Distal pulses 2+ symmetrically. No carotid or abdominal bruits. Musculoskeletal: Full range of motion and 5/5 strength throughout.  Skin: Warm and moist. Posterior aspect of Left Calf: Approx 1 inch vertical x 1/2 inch horizontal area of hyperpigmentation. Splotchy borders. Unraised. In center of this, is ~ 1cm diameter firm nodule.  Neuro: A+Ox3. CN II-XII grossly intact. Moves all extremities spontaneously. Full sensation throughout. Normal gait.  Psych:  Responds to questions appropriately with a normal affect.   Assessment/Plan:  41 y.o. y/o AA male here for  Visit for preventive health examination A. Screening Labs: He is not fasting but can return fasting in morning for labs. - COMPLETE METABOLIC PANEL WITH GFR - TSH - VITAMIN D 25 Hydroxy (Vit-D Deficiency, Fractures) - Hemoglobin A1c - Lipid panel; Future  B. Screening For Prostate Cancer: Given he is [place], -- age 64--- will go ahead and check PSA with labs  C. Screening For Colorectal Cancer:  Will start at age 61  D. Immunizations: Flu---------------------------N/A Tetanus---------------------He reports he received tetanus vaccine around 2011--says he was shot in thigh-- Pneumococcal-----------Given he is a smoker,  Pneumovax 23--Given here 12/13/2015     No further pneumonia vaccine until age 73 Shingrix---------------------Will discuss at age 71    Uncontrolled type 2 diabetes mellitus with hyperglycemia, with long-term current use of insulin  ([place])  He is intolerant to Metformin--GI adv effects (May re-try this in future once hemorrhoid gone, but definitley not until then) On Actos 45m QD.  On Levemir 50 units QHS Hold regarding aspirin until hemorrhoid has been managed.  02/08/2017--ReStart Lisinopril 178mQD  So he is on ACE Inh given Diabetes.   12/2015--On no Statin----FLP showed Triglycerides 2221, LDL noncalculable. Waiting to get BS down then recheck FLP to determine statin dose.  02/08/2017: He is not fasting today. Will return fasting for labs tomorrow.   ADDENDUM---ADDED 02/16/2016: I received note from [facility]---- Dr. AnMarcial Pacas- noted --"patient has swollen optic nerves. He needs referral to neurology. I will follow his nerve swelling."  They made appointment for patient to be seen a [facility] on 02/16/16 9 AM.   Hypertension 02/08/2017: Given Diabetes, Needs ACE Inhibitor.  02/08/2017:Given tachycardia will restart beta blocker. At prior visit he had tachycardia and today heart rate elevated again so will restart Toprol 25. 02/08/2017--ReStart Lisinopril 103mD, Restart Toprol 35m64m 02/08/2017--Return in 2 weeks to recheck BP and BMET.   Bleeding external hemorrhoids 12/2015---Per General Surgery ADDENDUM ADDED 12/30/2015: [facility]Dr. HaywFanny Skatesed 12/29/15.  "swollen hemorrhoids have improved a great deal since recent hospitalization. You do not need surgery. The bleeding should stop and the swelling will go down. Continue to take stool softener twice a day and avoid constipation.  Continue to use hydrocortisone cream twice a day.  F/U there PRN   Smoker Will discuss cessation at future f/u visit.   F/U OV 2 weeks. Recheck BP, BMET, Discuss f/u with Neurology--Regarding [place] from [facility]---I did referral in past--- Sooner if needed.    Signed:   [id number]oStarbuckFMPennsylvaniaRhode Island28/2018 8:47 AM

## 2017-02-09 ENCOUNTER — Other Ambulatory Visit: Payer: BLUE CROSS/BLUE SHIELD

## 2017-02-09 ENCOUNTER — Other Ambulatory Visit: Payer: Self-pay | Admitting: Physician Assistant

## 2017-02-09 DIAGNOSIS — Z794 Long term (current) use of insulin: Secondary | ICD-10-CM

## 2017-02-09 DIAGNOSIS — E119 Type 2 diabetes mellitus without complications: Secondary | ICD-10-CM

## 2017-02-09 DIAGNOSIS — Z0000 Encounter for general adult medical examination without abnormal findings: Secondary | ICD-10-CM

## 2017-02-09 LAB — CBC WITH DIFFERENTIAL/PLATELET
BASOS ABS: 0 {cells}/uL (ref 0–200)
Basophils Relative: 0 %
EOS PCT: 1 %
Eosinophils Absolute: 65 cells/uL (ref 15–500)
HCT: 42.8 % (ref 38.5–50.0)
HEMOGLOBIN: 14 g/dL (ref 13.0–17.0)
LYMPHS ABS: 2860 {cells}/uL (ref 850–3900)
Lymphocytes Relative: 44 %
MCH: 26.6 pg — AB (ref 27.0–33.0)
MCHC: 32.7 g/dL (ref 32.0–36.0)
MCV: 81.2 fL (ref 80.0–100.0)
MPV: 8.9 fL (ref 7.5–12.5)
Monocytes Absolute: 455 cells/uL (ref 200–950)
Monocytes Relative: 7 %
NEUTROS ABS: 3120 {cells}/uL (ref 1500–7800)
Neutrophils Relative %: 48 %
Platelets: 309 10*3/uL (ref 140–400)
RBC: 5.27 MIL/uL (ref 4.20–5.80)
RDW: 16.6 % — ABNORMAL HIGH (ref 11.0–15.0)
WBC: 6.5 10*3/uL (ref 3.8–10.8)

## 2017-02-09 LAB — TSH: TSH: 1.63 m[IU]/L (ref 0.40–4.50)

## 2017-02-10 LAB — HEMOGLOBIN A1C
HEMOGLOBIN A1C: 7.7 % — AB (ref <5.7)
MEAN PLASMA GLUCOSE: 174 mg/dL

## 2017-02-10 LAB — COMPLETE METABOLIC PANEL WITH GFR
ALBUMIN: 4.2 g/dL (ref 3.6–5.1)
ALK PHOS: 97 U/L (ref 40–115)
ALT: 16 U/L (ref 9–46)
AST: 13 U/L (ref 10–40)
BILIRUBIN TOTAL: 0.4 mg/dL (ref 0.2–1.2)
BUN: 11 mg/dL (ref 7–25)
CO2: 19 mmol/L — ABNORMAL LOW (ref 20–31)
Calcium: 9 mg/dL (ref 8.6–10.3)
Chloride: 106 mmol/L (ref 98–110)
Creat: 1.11 mg/dL (ref 0.60–1.35)
GFR, EST NON AFRICAN AMERICAN: 83 mL/min (ref >=60)
GFR, Est African American: >89 mL/min (ref >=60)
GLUCOSE: 111 mg/dL — AB (ref 70–99)
Potassium: 4.1 mmol/L (ref 3.5–5.3)
SODIUM: 139 mmol/L (ref 135–146)
TOTAL PROTEIN: 6.6 g/dL (ref 6.1–8.1)

## 2017-02-10 LAB — MICROALBUMIN, URINE: MICROALB UR: 0.9 mg/dL

## 2017-02-10 LAB — LIPID PANEL
Cholesterol: 161 mg/dL (ref <200)
HDL: 32 mg/dL — ABNORMAL LOW (ref >40)
LDL Cholesterol: 104 mg/dL — ABNORMAL HIGH (ref <100)
Total CHOL/HDL Ratio: 5 Ratio — ABNORMAL HIGH (ref <5.0)
Triglycerides: 124 mg/dL (ref <150)
VLDL: 25 mg/dL (ref <30)

## 2017-02-10 LAB — PSA: PSA: 0.9 ng/mL (ref <=4.0)

## 2017-02-12 ENCOUNTER — Other Ambulatory Visit: Payer: Self-pay

## 2017-02-12 DIAGNOSIS — E7800 Pure hypercholesterolemia, unspecified: Secondary | ICD-10-CM

## 2017-02-12 MED ORDER — SIMVASTATIN 10 MG PO TABS: 10.0000 mg | ORAL_TABLET | Freq: Every day | ORAL | 1 refills | Status: DC

## 2017-02-12 MED ORDER — INSULIN DETEMIR 100 UNIT/ML FLEXPEN: 50.0000 [IU] | PEN_INJECTOR | Freq: Every day | SUBCUTANEOUS | 5 refills | Status: DC

## 2017-02-12 NOTE — Telephone Encounter (Signed)
Refill appropriate 

## 2017-02-22 ENCOUNTER — Ambulatory Visit (INDEPENDENT_AMBULATORY_CARE_PROVIDER_SITE_OTHER): Payer: BLUE CROSS/BLUE SHIELD | Admitting: Physician Assistant

## 2017-02-22 VITALS — BP 140/86 | HR 107 | Temp 98.1°F | Resp 18 | Ht 69.0 in | Wt 238.8 lb

## 2017-02-22 DIAGNOSIS — E785 Hyperlipidemia, unspecified: Secondary | ICD-10-CM | POA: Diagnosis not present

## 2017-02-22 DIAGNOSIS — H4710 Unspecified papilledema: Secondary | ICD-10-CM | POA: Diagnosis not present

## 2017-02-22 DIAGNOSIS — Z794 Long term (current) use of insulin: Secondary | ICD-10-CM

## 2017-02-22 DIAGNOSIS — E1165 Type 2 diabetes mellitus with hyperglycemia: Secondary | ICD-10-CM

## 2017-02-22 DIAGNOSIS — E119 Type 2 diabetes mellitus without complications: Secondary | ICD-10-CM | POA: Diagnosis not present

## 2017-02-22 DIAGNOSIS — I10 Essential (primary) hypertension: Secondary | ICD-10-CM | POA: Diagnosis not present

## 2017-02-22 DIAGNOSIS — IMO0001 Reserved for inherently not codable concepts without codable children: Secondary | ICD-10-CM

## 2017-02-22 MED ORDER — PIOGLITAZONE HCL 45 MG PO TABS: 45.0000 mg | ORAL_TABLET | Freq: Every day | ORAL | 3 refills | Status: DC

## 2017-02-22 NOTE — Progress Notes (Addendum)
Patient ID: Kevin Yates MRN: [id number], DOB: February 13, 1976 41 y.o. Date of Encounter: 02/22/2017, 8:17 AM    Chief Complaint:  "swelling around anal region"  HPI: 41 y.o. y/oAA male here for above.   12/13/2015:  Presents for CPE. His wife is also here with him for OV today.  Pt reports they have lived in [place] for about 3 years.  Says he had insurance, then was without insurance, now with insurance again.   Says "blood pressure always borderline."  Says "took BP med before moved to [facility]"  Recently, has not seen any medical provider on routine basis. Just U/C as needed for acute illness etc.  Past few weeks, his "mouth is constantly dry" and "urinating frequently."  Smokes about 8-10 cigarettes per day (about 1/2 ppd). For about 20 years. Never more than current amount. About 1/2 ppd for about 20 years.   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  No other complaints/concerns.  No other PMH. Has had no surgeries.  Family History reviewed today. Both mom and dad with HTN. No Diabetes. No family h/o premature CAD or CA.   Labs Performed 12/13/2015 significant for:  Glucose-- 586      HgbA1C--- 15.2  He was then scheduled f/u OV here with Dr. Buelah Manis 12/14/2014.  At that visit, he was taught how to check BS, give Insulin, etc. Was started on Insulin 10 units daily and Metformin 538m BID.   --12/15/2015: He reports that he took his first dose of Metformin last night.  States that last night, during the night, he had bad abdominal pains and lots of diarrhea.  Says that he does not usually have problems with diarrhea or loose stools.  Says stools are "usually normal".   Pt reports that "anal area seems swollen now".  Discussed that it may be secondary to irritation from diarrhea.  Wife says she wants me to look at it--says she looked at it at home, but "isn't used to looking at that area and doesn't know what it should look like".  I then examined pt---see "Physical Exam" below.  Even after  this, I asked pt, repeatedly, if he had noticed any mass or irritation in his anal region before today---and he repeatedly says that he just noticed / just developed this problem today.  Says he usually has "normal stools" with no difficulty with that either.   At that visit---had pt go directly to ER. He subsequently was evaluated by General Surgery. Initially, was admitted to hospital to get sugar controlled and have surgery of Hemorrhoids.  However, I then received note that Surgeon was concerned that may not be the best approach and no surgery was performed.  12/27/2015: Today pt reports he was kept in hospital 5 days. Says he has appt to f/u with Surgeon Wednesday to decide plan.  I reviewed Discharge Summary: Hospitalized 12/16/15- 12/19/15. "Operatie management would have sacrificed a lot of perianal skin and increased risk of anal stenosis" He received a lot of diabetic education while in hte hospitla. Says he has lots of booklets and brochures to read.  Says he is taking actos 463mQ AM and Levemir 40 units QHS. Brings BS log--has 2 fasting readings--241, 265.  2 readings from 8pm--277,372.  I then explained how to titrate up insulin by 2 units unitl fasting sugars at goal. He seemed a little confused---I had nurse go in later and make sure he knew how to dial up to correct units----It turned out he has been giving 4 units  instead of 40.  He is to go home and give 40 units, as they were doing in the hospital and f/u if BS too high or too low.   He says "Carbs was his favorite food group". Says he was eating a lot of sweets and a lot of junk food. A lot of sweet tea and cookies.  Also says he quit smoking.  "Hasn't had a cigarette since he came here"---yay!!  Today he does have one additional concern to address. Has area on back of left calf---says in past it was "a bump there"--it was removed and biopsied. Says after that, this "hard bump developed at that spot and this dark rash developed  around it"  ADDENDUM---ADDED 02/16/2016: I received note from [facility]  ---- Dr. Marcial Pacas--- noted --"patient has swollen optic nerves. He needs referral to neurology. I will follow his nerve swelling."  They made appointment for patient to be seen a [facility] on 02/16/16 9 AM.   02/08/2017: Today patient is on the schedule as a complete physical exam. Today his wife accompanies him for visit. Today when I go in the room I asked where he has been for the past year and he responds "I've been sitting right down the road at my house"--- wife then adds that she "had to drag him in here" Staff noted to me that when reviewing medication list he reported that he has not recently been taking the metoprolol or the lisinopril. I reviewed this with him and he verifies this is the case. Has no real reason why he has discontinued it. He states that he is doing 50 units of Levemir every night and he is taking the Actos 45 mg every day. Repeatedly says that he has been compliant with taking both of these on a daily basis and has not been skipping them etc. However these 2 are the only medications he is taking. I also asked about the smoking. He says that he started back smoking and is smoking about one half pack per day. At end of visit wife asked if I can give them some information regarding the foods that he should be eating. I told her that I am out of time today but that we will go over this at follow-up office visit in 2 weeks.(I know that he was given a lot of this information when his diet and diabetes was diagnosed both at the hospital and by me but they both act like they have never heard any information before) He has no specific concerns to address today. Neither one of them brought up any further issues that need to be addressed.   02/22/2017: Last office visit I restarted his prior doses of lisinopril and metoprolol. Today verified that he did restart these and is taking these  daily. At last office visit we did obtain fasting labs. A1c came back good at 7.7. Microalbumin normal. CBC normal. CMET normal. Lipid panel did show LDL 104. TSH and PSA normal. At that result note stated to add simvastatin 10 mg daily and recheck FLP LFT 6 weeks. He reports that he did add the simvastatin and is taking this daily. He also is reports that he is scheduled to return for fasting labs on August 13. Today also discussed report I had received and need for f/u with Neurology. AT his LOV I had discussed this and I ordered that referral but then he was lost for f/u unitl now.  Today he reports that he  has had this problem in the past. Has had 2 lumbar punctures in the past. However, was seeing Neuro in [place] for those. I encouraged him to f/u with the Neurologist here and he agrees. He has no concerns today.  - -- - - - - - - - - - - - - - - - - - - - - - - - - - - - -  ADDENDUM ADDED 09/06/2017: Received record from [facility] from patient's visit there 09/03/2017. Presented with "swollen optic nerves referred by Dr. 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 Eyes: No visual changes, eye redness, or discharge. ENT/Mouth: Ears: No otalgia, tinnitus, hearing loss, discharge. Nose: No congestion, rhinorrhea, sinus pain, or epistaxis. Throat: No sore throat, post nasal drip, or teeth pain. Cardiovascular: No CP, palpitations, diaphoresis, DOE, edema, orthopnea, PND. Respiratory: No cough,  hemoptysis, SOB, or wheezing. Gastrointestinal: No anorexia, dysphagia, reflux, pain, nausea, vomiting, hematemesis, constipation, BRBPR, or melena. Genitourinary: No dysuria, frequency, urgency, hematuria, incontinence, nocturia, decreased urinary stream, discharge, impotence, or testicular pain/masses. Musculoskeletal: No decreased ROM, myalgias, stiffness, joint swelling, or weakness. Skin: No rash, erythema, lesion changes, pain, warmth, jaundice, or pruritis. Neurological: No headache, dizziness, syncope, seizures, tremors, memory loss, coordination problems, or paresthesias. Psychological: No anxiety, depression, hallucinations, SI/HI. Endocrine: Known diabetes. All other systems were reviewed and are otherwise negative.  Past Medical History:  Diagnosis Date  . Diabetes mellitus without complication ([place])   . Hypertension      No past surgical history on file.  Home Meds:  Outpatient Medications Prior to Visit  Medication Sig Dispense Refill  . BAYER MICROLET LANCETS lancets   10  . Blood Glucose Monitoring Suppl (BLOOD GLUCOSE SYSTEM PAK) KIT Please dispense based on patient and insurance preference. Use as directed to monitor FSBS 3x daily. Dx: E11.65. 1 each 1  . Glucose Blood (BLOOD GLUCOSE TEST STRIPS) STRP Please dispense based on patient and insurance preference. Use as directed to monitor FSBS 3x daily. Dx: E11.65. 100 each 11  . Insulin Detemir (LEVEMIR FLEXTOUCH) 100 UNIT/ML Pen Inject 50 Units into the skin daily at 10 pm. 5 pen 5  . Insulin Pen Needle (PEN NEEDLES) 31G X 6 MM MISC 1 each by Does not apply route daily. 100 each 3  . Lancet Devices MISC Please dispense based on patient and insurance preference. Use as directed to monitor FSBS 3x daily. Dx: E11.65. 1 each 1  . lisinopril (PRINIVIL,ZESTRIL) 10 MG tablet Take 1 tablet (10 mg total) by mouth daily. 30 tablet 2  . metoprolol succinate (TOPROL-XL) 25 MG 24 hr tablet TAKE 1 TABLET (25 MG TOTAL) BY MOUTH DAILY.  30 tablet 2  . pioglitazone (ACTOS) 45 MG tablet TAKE 1 TABLET BY MOUTH EVERY DAY 30 tablet 3  . simvastatin (ZOCOR) 10 MG tablet Take 1 tablet (10 mg total) by mouth at bedtime. 30 tablet 1   No facility-administered medications prior to visit.  Allergies:  Allergies  Allergen Reactions  . Metformin And Related Diarrhea    After one dose, developed abdominal pain and diarrhea---12/14/2015    Social History   Social History  . Marital status: Married    Spouse name: N/A  . Number of children: N/A  . Years of education: N/A   Occupational History  . Not on file.   Social History Main Topics  . Smoking status: Current Every Day Smoker    Packs/day: 0.50    Types: Cigarettes  . Smokeless tobacco: Never Used  . Alcohol use 4.8 oz/week    8 Standard drinks or equivalent per week     Comment: mostly on weekend  . Drug use: No  . Sexual activity: Yes   Other Topics Concern  . Not on file   Social History Narrative  . No narrative on file    Family History  Problem Relation Age of Onset  . Hypertension Mother   . Hypertension Father     Physical Exam: Blood pressure 140/86, pulse (!) 107, temperature 98.1 F (36.7 C), temperature source Oral, resp. rate 18, height '5\' 9"'  (1.753 m), weight 238 lb 12.8 oz (108.3 kg), SpO2 98 %.  General: Well developed, well nourished,AAM. Appears in no acute distress. Neck: Supple. Trachea midline. No thyromegaly. Full ROM. No lymphadenopathy.No carotid bruits. Lungs: Clear to auscultation bilaterally without wheezes, rales, or rhonchi. Breathing is of normal effort and unlabored. Cardiovascular: RRR with S1 S2. No murmurs, rubs, or gallops. Distal pulses 2+ symmetrically. No carotid or abdominal bruits. Musculoskeletal: Full range of motion and 5/5 strength throughout.  Skin: Warm and moist. Diabetic Foot Exam: Inspection is normal. No wounds or callouses or problem areas. Neuro: A+Ox3. CN II-XII grossly intact. Moves all  extremities spontaneously. Full sensation throughout. Normal gait.  Psych:  Responds to questions appropriately with a normal affect.   Assessment/Plan:  41 y.o. y/o AA male here for   Type 2 diabetes mellitus without complication, with long-term current use of insulin ([place])  He is intolerant to Metformin--GI adv effects (May re-try this in future once hemorrhoid gone, but definitley not until then) On Actos 24m QD.  On Levemir 50 units QHS Hold regarding aspirin until hemorrhoid has been managed.  02/08/2017--ReStart Lisinopril 122mQD  So he is on ACE Inh given Diabetes.   12/2015--On no Statin----FLP showed Triglycerides 2221, LDL noncalculable. Waiting to get BS down then recheck FLP to determine statin dose.  02/08/2017: He is not fasting today. Will return fasting for labs tomorrow. 02/22/2017: Now on statin 02/22/17--A1C is good. Cont current dose Actos and Insulin    ADDENDUM---ADDED 02/16/2016: I received note from [facility]---- Dr. AnMarcial Pacas- noted --"patient has swollen optic nerves. He needs referral to neurology. I will follow his nerve swelling."  They made appointment for patient to be seen a [facility] on 02/16/16 9 AM.  Optic nerve swelling - Ambulatory referral to Neurology  ADDENDUM ADDED 09/06/2017: [facility]. Presented with "swollen optic nerves referred by Dr. 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  02/08/2017:Given tachycardia  will restart beta blocker. At prior visit he had  tachycardia and today heart rate elevated again so will restart Toprol 25. 02/08/2017--ReStart Lisinopril 9m QD, Restart Toprol 260mQD 02/08/2017--Return in 2 weeks to recheck BP and BMET.  02/22/2017: BP at goal. Cont current meds    Hyperlipidemia, unspecified hyperlipidemia type 02/08/2017--Started Simvatatin. He is scheduled to return for fasting labs Aug 13.    THE FOLLOWING IS COPIED FROM HIS PRIOR CPE 02/08/2017: Visit for preventive health examination A. Screening Labs: He is not fasting but can return fasting in morning for labs. - COMPLETE METABOLIC PANEL WITH GFR - TSH - VITAMIN D 25 Hydroxy (Vit-D Deficiency, Fractures) - Hemoglobin A1c - Lipid panel; Future  B. Screening For Prostate Cancer: Given he is AfSerbiamerican, -- age 41- will go ahead and check PSA with labs  C. Screening For Colorectal Cancer:  Will start at age [age]D. Immunizations: Flu---------------------------N/A Tetanus---------------------He reports he received tetanus vaccine around 2011--says he was shot in thigh-- Pneumococcal-----------Given he is a smoker,  Pneumovax 23--Given here 12/13/2015     No further pneumonia vaccine until age [age]hingrix---------------------Will discuss at age 41   Bleeding external hemorrhoids 12/2015---Per General Surgery ADDENDUM ADDED 12/30/2015: [facility]Dr. HaFanny Skatesated 12/29/15.  "swollen hemorrhoids have improved a great deal since recent hospitalization. You do not need surgery. The bleeding should stop and the swelling will go down. Continue to take stool softener twice a day and avoid constipation.  Continue to use hydrocortisone cream twice a day.  F/U there PRN   Smoker Will discuss cessation at future f/u visit.   Returnf for fLP/LFT Aug 13 F/U with Neuro F/U OV with me 3 months, sooner if needed.   Signed:   [id number] Bow Ridge StreetiPlainfieldBSPennsylvaniaRhode Island7/07/2017 8:17 AM

## 2017-02-27 ENCOUNTER — Encounter: Payer: Self-pay | Admitting: Neurology

## 2017-02-27 ENCOUNTER — Ambulatory Visit (INDEPENDENT_AMBULATORY_CARE_PROVIDER_SITE_OTHER): Payer: BLUE CROSS/BLUE SHIELD | Admitting: Neurology

## 2017-02-27 VITALS — BP 144/90 | HR 99 | Ht 69.0 in | Wt 239.1 lb

## 2017-02-27 DIAGNOSIS — H4710 Unspecified papilledema: Secondary | ICD-10-CM | POA: Diagnosis not present

## 2017-02-27 DIAGNOSIS — G4733 Obstructive sleep apnea (adult) (pediatric): Secondary | ICD-10-CM

## 2017-02-27 DIAGNOSIS — Z720 Tobacco use: Secondary | ICD-10-CM

## 2017-02-27 DIAGNOSIS — I10 Essential (primary) hypertension: Secondary | ICD-10-CM

## 2017-02-27 MED ORDER — ACETAZOLAMIDE ER 500 MG PO CP12: 500.0000 mg | ORAL_CAPSULE | Freq: Two times a day (BID) | ORAL | 2 refills | Status: DC

## 2017-02-27 NOTE — Addendum Note (Signed)
Addended byEverlena Cooper, Apolinar Bero R on: 02/27/2017 10:12 AM   Modules accepted: Orders

## 2017-02-27 NOTE — Progress Notes (Addendum)
NEUROLOGY CONSULTATION NOTE  Kevin Yates MRN: [id number] DOB: August 22, 1975  Referring provider: Dena Billet, PA-C Primary care provider: Dena Billet, PA-C  Reason for consult:  Optic nerve swelling  HISTORY OF PRESENT ILLNESS: Kevin Yates is a 41 year old right-handed African American male with hypertension, type 2 diabetes and hyperlipidemia who presents for optic nerve swelling.  History supplemented by PCP note.  About 5 years ago, he had a routine eye exam and was found to have optic disc swelling.  He was referred to a neurologist in [place], Dr. Lenward Chancellor, for further evaluation.  He reports that he underwent brain MRI and LP.  He was found to have increased opening pressure on LP.  He was never started on any therapy.  Every time he has a routine eye exam, papilledema is again noted and he was again referred to Dr. Lenward Chancellor a couple of years later when he had a second LP which verified increased opening pressure.  Again, he said he was never started on any therapy.  He recently had an eye exam by a new eye doctor and is now sent to me for further evaluation.  He denies headache, visual obscuration or pulsatile tinnitus.  I do not have notes from his eye doctor, but he says he did not demonstrate visual field loss.  PAST MEDICAL HISTORY: Past Medical History:  Diagnosis Date  . Diabetes mellitus without complication ([place])   . Hypertension     PAST SURGICAL HISTORY: History reviewed. No pertinent surgical history.  MEDICATIONS: Current Outpatient Prescriptions on File Prior to Visit  Medication Sig Dispense Refill  . BAYER MICROLET LANCETS lancets   10  . Blood Glucose Monitoring Suppl (BLOOD GLUCOSE SYSTEM PAK) KIT Please dispense based on patient and insurance preference. Use as directed to monitor FSBS 3x daily. Dx: E11.65. 1 each 1  . Glucose Blood (BLOOD GLUCOSE TEST STRIPS) STRP Please dispense based on patient and insurance preference. Use as directed to monitor FSBS 3x daily.  Dx: E11.65. 100 each 11  . Insulin Detemir (LEVEMIR FLEXTOUCH) 100 UNIT/ML Pen Inject 50 Units into the skin daily at 10 pm. 5 pen 5  . Insulin Pen Needle (PEN NEEDLES) 31G X 6 MM MISC 1 each by Does not apply route daily. 100 each 3  . Lancet Devices MISC Please dispense based on patient and insurance preference. Use as directed to monitor FSBS 3x daily. Dx: E11.65. 1 each 1  . lisinopril (PRINIVIL,ZESTRIL) 10 MG tablet Take 1 tablet (10 mg total) by mouth daily. 30 tablet 2  . metoprolol succinate (TOPROL-XL) 25 MG 24 hr tablet TAKE 1 TABLET (25 MG TOTAL) BY MOUTH DAILY. 30 tablet 2  . pioglitazone (ACTOS) 45 MG tablet Take 1 tablet (45 mg total) by mouth daily. 90 tablet 3  . simvastatin (ZOCOR) 10 MG tablet Take 1 tablet (10 mg total) by mouth at bedtime. 30 tablet 1   No current facility-administered medications on file prior to visit.     ALLERGIES: Allergies  Allergen Reactions  . Metformin And Related Diarrhea    After one dose, developed abdominal pain and diarrhea---12/14/2015    FAMILY HISTORY: Family History  Problem Relation Age of Onset  . Hypertension Mother   . Hypertension Father     SOCIAL HISTORY: Social History   Social History  . Marital status: Married    Spouse name: N/A  . Number of children: 1  . Years of education: 51   Occupational History  . Not on  file.   Social History Main Topics  . Smoking status: Current Every Day Smoker    Packs/day: 0.50    Types: Cigarettes  . Smokeless tobacco: Never Used  . Alcohol use 4.8 oz/week    8 Standard drinks or equivalent per week     Comment: mostly on weekend  . Drug use: No  . Sexual activity: Yes    Partners: Female   Other Topics Concern  . Not on file   Social History Narrative   Lives with wife and daughter in a 2 story home.  Has one daughter.  Works on used cars.  Education: college.    REVIEW OF SYSTEMS: Constitutional: No fevers, chills, or sweats, no generalized fatigue, change in  appetite Eyes: No visual changes, double vision, eye pain Ear, nose and throat: No hearing loss, ear pain, nasal congestion, sore throat Cardiovascular: No chest pain, palpitations Respiratory:  No shortness of breath at rest or with exertion, wheezes GastrointestinaI: No nausea, vomiting, diarrhea, abdominal pain, fecal incontinence Genitourinary:  No dysuria, urinary retention or frequency Musculoskeletal:  No neck pain, back pain Integumentary: No rash, pruritus, skin lesions Neurological: as above Psychiatric: No depression, insomnia, anxiety Endocrine: No palpitations, fatigue, diaphoresis, mood swings, change in appetite, change in weight, increased thirst Hematologic/Lymphatic:  No purpura, petechiae. Allergic/Immunologic: no itchy/runny eyes, nasal congestion, recent allergic reactions, rashes  PHYSICAL EXAM: Vitals:   02/27/17 0807  BP: (!) 144/90  Pulse: 99   General: No acute distress.  Patient appears well-groomed.  Head:  Normocephalic/atraumatic Eyes:  fundi reveals some optic disc swelling bilaterally Neck: supple, no paraspinal tenderness, full range of motion Back: No paraspinal tenderness Heart: regular rate and rhythm Lungs: Clear to auscultation bilaterally. Vascular: No carotid bruits. Neurological Exam: Mental status: alert and oriented to person, place, and time, recent and remote memory intact, fund of knowledge intact, attention and concentration intact, speech fluent and not dysarthric, language intact. Cranial nerves: CN I: not tested CN II: pupils equal, round and reactive to light, visual fields intact CN III, IV, VI:  full range of motion, no nystagmus, no ptosis CN V: facial sensation intact CN VII: upper and lower face symmetric CN VIII: hearing intact CN IX, X: gag intact, uvula midline CN XI: sternocleidomastoid and trapezius muscles intact CN XII: tongue midline Bulk & Tone: normal, no fasciculations. Motor:  5/5 throughout  Sensation:  temperature and vibration sensation intact. Deep Tendon Reflexes:  2+ throughout, toes downgoing.  Finger to nose testing:  Without dysmetria.  Heel to shin:  Without dysmetria.  Gait:  Normal station and stride.  Able to turn and tandem walk. Romberg negative.  IMPRESSION: Papilledema Hypertension Tobacco use disorder  PLAN: 1.  I will first obtain notes from his eye doctor and will try to obtain notes from his prior neurologist, Dr. Lenward Chancellor, at [facility] to see what testing has been done, as well as the results.  If we are not able to obtain test results, then we may need to repeat testing (MRI/MRV of head, lumbar puncture with opening pressure and CSF analysis).  2.  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  He should then have another eye exam in about 6 weeks to evaluate for improvement. 3.  He will follow up with me in 3 months. 4.  Smoking cessation 5.  Blood pressure is a little elevated and should be followed up with his  PCP.  Thank you for allowing me to take part in the care of this patient.  Metta Clines, DO  CC:  Dena Billet, PA-C  ADDENDUM:  I received notes from his prior neurologist in 2011  MRI and MRV of the brain were reportedly unremarkable.  Lumbar puncture revealed an opening pressure of 340 mmH2O.  CSF analysis was unremarkable.  He was actually started on acetazolamide 260m twice daily, which did not improve disc edema.  He was found to have sleep apnea, and it was suspected that the untreated sleep apnea was contributing to the intracranial hypertension.  He will start acetazolamide ER 5061mtwice daily.  He should follow up with his optometrist in about 6 weeks.  I also will refer him to a sleep specialist so he can get his OSA treated.  AdMetta ClinesDO

## 2017-02-27 NOTE — Patient Instructions (Addendum)
1.  I will contact Dr. Darrelyn Hillock office to see if all testing that I require was done.  If not, then we may need to repeat tests.   2.  Once tests are reviewed, I will start you on acetazolamide to treat the increased spinal fluid pressure 3.  Follow up in 3 months. 4.  Blood pressure is borderline elevated, so follow up with your PCP about that.

## 2017-02-27 NOTE — Addendum Note (Signed)
Addended by: Vivien Rota on: 10/09/7503 10:22 AM   Modules accepted: Orders

## 2017-03-01 ENCOUNTER — Other Ambulatory Visit (INDEPENDENT_AMBULATORY_CARE_PROVIDER_SITE_OTHER): Payer: BLUE CROSS/BLUE SHIELD

## 2017-03-01 DIAGNOSIS — Z720 Tobacco use: Secondary | ICD-10-CM

## 2017-03-01 DIAGNOSIS — G4733 Obstructive sleep apnea (adult) (pediatric): Secondary | ICD-10-CM

## 2017-03-01 DIAGNOSIS — H4710 Unspecified papilledema: Secondary | ICD-10-CM

## 2017-03-01 DIAGNOSIS — I10 Essential (primary) hypertension: Secondary | ICD-10-CM

## 2017-03-01 LAB — BASIC METABOLIC PANEL
BUN: 14 mg/dL (ref 6–23)
CALCIUM: 9.4 mg/dL (ref 8.4–10.5)
CO2: 26 mEq/L (ref 19–32)
Chloride: 105 mEq/L (ref 96–112)
Creatinine, Ser: 1.29 mg/dL (ref 0.40–1.50)
GFR: 78.94 mL/min (ref >60.00)
GLUCOSE: 199 mg/dL — AB (ref 70–99)
Potassium: 3.8 mEq/L (ref 3.5–5.1)
Sodium: 138 mEq/L (ref 135–145)

## 2017-03-21 ENCOUNTER — Ambulatory Visit
Admit: 2017-03-21 | Discharge: 2017-03-21 | Payer: PRIVATE HEALTH INSURANCE | Attending: Internal Medicine | Primary: Internal Medicine

## 2017-03-21 DIAGNOSIS — F901 Attention-deficit hyperactivity disorder, predominantly hyperactive type: Secondary | ICD-10-CM

## 2017-03-21 MED ORDER — AMPHETAMINE-DEXTROAMPHET ER 20 MG PO CP24
20 MG | ORAL_CAPSULE | Freq: Every morning | ORAL | 0 refills | Status: DC
Start: 2017-03-21 — End: 2017-04-24

## 2017-03-21 NOTE — Progress Notes (Signed)
Subjective:      Patient ID: [name] is a 41 y.o. male.    HPI  Here for a NP establishment,   Needs new problem addressed.  Attention deficit hyperactivity disorder (ADHD), predominantly hyperactive type  He has this problem for a long time since he has been a kid.  He cannot focus, concentrate, gets distracted too easily,  Mind races,   Currently affecting his job,  Have never been on any meds for this.   Would like to start some med for this, if it can help,  Counseled regarding the meds and the controlled status of the meds.  Will try adderall xr 20 mg once daily in the am.   F/u in 1 month.     Past Medical History:   Diagnosis Date   . Attention deficit hyperactivity disorder (ADHD), predominantly hyperactive type 03/21/2017       No past surgical history on file.    No current outpatient prescriptions on file.     No current facility-administered medications for this visit.        No Known Allergies    Social History     Social History   . Marital status: Married     Spouse name: N/A   . Number of children: N/A   . Years of education: N/A     Occupational History   . Not on file.     Social History Main Topics   . Smoking status: Never Smoker   . Smokeless tobacco: Current User     Types: Snuff   . Alcohol use No   . Drug use: No   . Sexual activity: Not on file     Other Topics Concern   . Not on file     Social History Narrative   . No narrative on file       No family history on file.   Review of Systems  ROS: No unusual headaches or allergy symptoms or blurred vision. No prolonged cough. No flushing or facial pain or chest pain,dizziness, dyspnea, palpitations, or chest pain on exertion. No syncope. No nausea or vommitting or diarrhea.  No jaundice or abdominal pain, change in bowel habits, black or bloody stools.  No dysuria or hematuria or frequency of urination.   No myalgias or muscle pain.  No numbness, weakness, or tingling. No falls, or loss of consciousness. No weight loss or back pain. No  falls.  No paresthesias. No joint swelling or redness. No joint pain. No recent weight loss. No focal weakness or sensory deficits or paresthesias, No confusion or altered sensorium. No hematemesis. No hearing loss. No siezures. All other systems were reviewed, and review was negative.   Objective:   Physical Exam  BP 129/66 (Site: Left Arm, Position: Sitting, Cuff Size: Medium Adult)   Pulse 70   Temp 97.1 F (36.2 C) (Oral)   Ht 5\' 7"  (1.702 m)   Wt 231 lb (104.8 kg)   BMI 36.18 kg/m    The physical exam reveals a patient who appears well, alert and oriented x 3, pleasant, cooperative. Vitals are as noted. Head is atraumatic and normocephalic. Eyes reveal normal conjunctiva, cornea normal, pupils are equal and rective to light. Nasal mucosa is normal. Throat is normal without exudates. Ears reveal normal tympanic membranes.Neck is supple and free of adenopathy, or masses. No thyromegaly. No jugular venous distension. Lungs are clear to auscultation, no rales or rhonchi noted. Heart sounds are regular , no  murmurs, clicks, gallops or rubs. Abdomen is soft, no tenderness, masses or organomegaly. Bowel sounds are normally heard.Pelvis: normal. Extremities are normal. Peripheral pulses are normal. Screening neurological exam is normal without focal findings. Cranial nerves are intact, reflexes are symmetrical and muscle strength eaqual. Skin is normal without suspicious lesions noted.     Assessment:      Attention deficit hyperactivity disorder (ADHD), predominantly hyperactive type  He has this problem for a long time since he has been a kid.  He cannot focus, concentrate, gets distracted too easily,  Mind races,   Currently affecting his job,  Have never been on any meds for this.   Would like to start some med for this, if it can help,  Counseled regarding the meds and the controlled status of the meds.  Will try adderall xr 20 mg once daily in the am.   F/u in 1 month.         Plan:      As above f/u in 1  month        Marcello FennelPrashanth R Unita Detamore, MD

## 2017-03-21 NOTE — Assessment & Plan Note (Signed)
He has this problem for a long time since he has been a kid.  He cannot focus, concentrate, gets distracted too easily,  Mind races,   Currently affecting his job,  Have never been on any meds for this.   Would like to start some med for this, if it can help,  Counseled regarding the meds and the controlled status of the meds.  Will try adderall xr 20 mg once daily in the am.   F/u in 1 month.

## 2017-03-26 ENCOUNTER — Other Ambulatory Visit: Payer: BLUE CROSS/BLUE SHIELD

## 2017-03-26 DIAGNOSIS — E7800 Pure hypercholesterolemia, unspecified: Secondary | ICD-10-CM

## 2017-03-26 LAB — LIPID PANEL
CHOL/HDL RATIO: 4.5 ratio (ref <5.0)
CHOLESTEROL: 121 mg/dL (ref <200)
HDL: 27 mg/dL — AB (ref >40)
LDL Cholesterol: 68 mg/dL (ref <100)
Triglycerides: 131 mg/dL (ref <150)
VLDL: 26 mg/dL (ref <30)

## 2017-03-26 LAB — HEPATIC FUNCTION PANEL
ALBUMIN: 4.1 g/dL (ref 3.6–5.1)
ALT: 22 U/L (ref 9–46)
AST: 12 U/L (ref 10–40)
Alkaline Phosphatase: 103 U/L (ref 40–115)
Bilirubin, Direct: 0.1 mg/dL (ref <=0.2)
Indirect Bilirubin: 0.2 mg/dL (ref 0.2–1.2)
Total Bilirubin: 0.3 mg/dL (ref 0.2–1.2)
Total Protein: 6.5 g/dL (ref 6.1–8.1)

## 2017-04-07 ENCOUNTER — Other Ambulatory Visit: Payer: Self-pay | Admitting: Physician Assistant

## 2017-04-09 NOTE — Telephone Encounter (Signed)
Refill appropriate 

## 2017-04-23 ENCOUNTER — Telehealth

## 2017-04-23 NOTE — Telephone Encounter (Signed)
Needs refill for adderall xr 20mg  1 qd  [facility]

## 2017-04-23 NOTE — Telephone Encounter (Signed)
Last ov 03/21/17

## 2017-04-24 ENCOUNTER — Encounter

## 2017-04-24 MED ORDER — AMPHETAMINE-DEXTROAMPHET ER 20 MG PO CP24
20 | ORAL_CAPSULE | Freq: Every morning | ORAL | 0 refills | Status: DC
Start: 2017-04-24 — End: 2017-04-24

## 2017-04-24 MED ORDER — AMPHETAMINE-DEXTROAMPHET ER 20 MG PO CP24
20 | ORAL_CAPSULE | Freq: Every morning | ORAL | 0 refills | Status: DC
Start: 2017-04-24 — End: 2017-04-25

## 2017-04-24 NOTE — Telephone Encounter (Signed)
Spoke with pt he is aware medication is sent

## 2017-04-24 NOTE — Telephone Encounter (Signed)
Sent.

## 2017-04-24 NOTE — Telephone Encounter (Signed)
[facility] [place] [facility] when he gets there and knows where he will be.

## 2017-04-24 NOTE — Telephone Encounter (Signed)
Pt works for [employer] and [place] being [place]. He needs his rx ASAP. He's leaving at 9:45am today.  Please call it in. And call pt

## 2017-04-25 ENCOUNTER — Telehealth

## 2017-04-25 MED ORDER — AMPHETAMINE-DEXTROAMPHET ER 20 MG PO CP24
20 | ORAL_CAPSULE | Freq: Every morning | ORAL | 0 refills | Status: DC
Start: 2017-04-25 — End: 2017-05-14

## 2017-04-25 NOTE — Telephone Encounter (Signed)
Put the order in with the exact pharmacy, and then I will sign off on it.
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
## 2017-04-25 NOTE — Telephone Encounter (Signed)
Order in with pharmacy

## 2017-04-25 NOTE — Telephone Encounter (Signed)
Please advise

## 2017-04-25 NOTE — Telephone Encounter (Signed)
Spoke with he is aware

## 2017-04-25 NOTE — Telephone Encounter (Signed)
Sent again. For the third time.

## 2017-04-26 ENCOUNTER — Encounter: Attending: Internal Medicine | Primary: Internal Medicine

## 2017-05-07 ENCOUNTER — Other Ambulatory Visit: Payer: Self-pay | Admitting: Physician Assistant

## 2017-05-07 NOTE — Telephone Encounter (Signed)
Refill appropriate 

## 2017-05-14 ENCOUNTER — Ambulatory Visit
Admit: 2017-05-14 | Discharge: 2017-05-14 | Payer: PRIVATE HEALTH INSURANCE | Attending: Internal Medicine | Primary: Internal Medicine

## 2017-05-14 DIAGNOSIS — F901 Attention-deficit hyperactivity disorder, predominantly hyperactive type: Secondary | ICD-10-CM

## 2017-05-14 MED ORDER — AMPHETAMINE-DEXTROAMPHET ER 20 MG PO CP24
20 | ORAL_CAPSULE | Freq: Every morning | ORAL | 0 refills | Status: DC
Start: 2017-05-14 — End: 2019-03-18

## 2017-05-14 NOTE — Assessment & Plan Note (Signed)
Doing ok,   some tolerance.   This problem is stable, reviewed in detail, and advised patient to continue the current instructions or medications. Will continue to closely monitor the situation.   No side effects,

## 2017-05-14 NOTE — Progress Notes (Signed)
Subjective:      Patient ID: [name] is a 41 y.o. male.    HPI  Established patient here for a visit to manage chronic medical conditions as detailed below.    Attention deficit hyperactivity disorder (ADHD), predominantly hyperactive type  Doing ok,   some tolerance.   This problem is stable, reviewed in detail, and advised patient to continue the current instructions or medications. Will continue to closely monitor the situation.   No side effects,     Review of Systems  ROS: No unusual headaches or allergy symptoms or blurred vision. No prolonged cough. No flushing or facial pain or chest pain,dizziness, dyspnea, palpitations, or chest pain on exertion. No syncope. No nausea or vommitting or diarrhea.  No jaundice or abdominal pain, change in bowel habits, black or bloody stools.  No dysuria or hematuria or frequency of urination.   No myalgias or muscle pain.  No numbness, weakness, or tingling. No falls, or loss of consciousness. No weight loss or back pain. No falls.  No paresthesias. No joint swelling or redness. No joint pain. No recent weight loss. No focal weakness or sensory deficits or paresthesias, No confusion or altered sensorium. No hematemesis. No hearing loss. No siezures. All other systems were reviewed, and review was negative.   Objective:   Physical Exam  BP 126/80 (Site: Right Upper Arm, Position: Sitting, Cuff Size: Medium Adult)   Pulse 63   SpO2 98%    The physical exam reveals a patient who appears well, alert and oriented x 3, pleasant, cooperative. Vitals are as noted. Head is atraumatic and normocephalic. Eyes reveal normal conjunctiva, cornea normal, pupils are equal and rective to light. Nasal mucosa is normal. Throat is normal without exudates. Ears reveal normal tympanic membranes.Neck is supple and free of adenopathy, or masses. No thyromegaly. No jugular venous distension. Lungs are clear to auscultation, no rales or rhonchi noted. Heart sounds are regular , no murmurs,  clicks, gallops or rubs. Abdomen is soft, no tenderness, masses or organomegaly. Bowel sounds are normally heard.Pelvis: normal. Extremities are normal. Peripheral pulses are normal. Screening neurological exam is normal without focal findings. Cranial nerves are intact, reflexes are symmetrical and muscle strength eaqual. Skin is normal without suspicious lesions noted.     Assessment:      Attention deficit hyperactivity disorder (ADHD), predominantly hyperactive type  Doing ok,   some tolerance.   This problem is stable, reviewed in detail, and advised patient to continue the current instructions or medications. Will continue to closely monitor the situation.   No side effects,         Plan:      This problem is stable, reviewed in detail, and advised patient to continue the current instructions or medications. Will continue to closely monitor the situation.         Marcello Fennel, MD

## 2017-05-24 ENCOUNTER — Encounter: Payer: Self-pay | Admitting: Physician Assistant

## 2017-05-24 ENCOUNTER — Ambulatory Visit (INDEPENDENT_AMBULATORY_CARE_PROVIDER_SITE_OTHER): Payer: BLUE CROSS/BLUE SHIELD | Admitting: Physician Assistant

## 2017-05-24 ENCOUNTER — Ambulatory Visit (INDEPENDENT_AMBULATORY_CARE_PROVIDER_SITE_OTHER): Payer: BLUE CROSS/BLUE SHIELD | Admitting: Pulmonary Disease

## 2017-05-24 ENCOUNTER — Encounter: Payer: Self-pay | Admitting: Pulmonary Disease

## 2017-05-24 VITALS — BP 128/80 | HR 96 | Ht 69.0 in | Wt 240.0 lb

## 2017-05-24 VITALS — BP 132/88 | HR 70 | Temp 97.9°F | Resp 14 | Ht 69.0 in | Wt 237.0 lb

## 2017-05-24 DIAGNOSIS — R0981 Nasal congestion: Secondary | ICD-10-CM | POA: Diagnosis not present

## 2017-05-24 DIAGNOSIS — M545 Low back pain, unspecified: Secondary | ICD-10-CM

## 2017-05-24 DIAGNOSIS — Z6835 Body mass index (BMI) 35.0-35.9, adult: Secondary | ICD-10-CM | POA: Diagnosis not present

## 2017-05-24 DIAGNOSIS — IMO0001 Reserved for inherently not codable concepts without codable children: Secondary | ICD-10-CM

## 2017-05-24 DIAGNOSIS — Z720 Tobacco use: Secondary | ICD-10-CM | POA: Diagnosis not present

## 2017-05-24 DIAGNOSIS — G4733 Obstructive sleep apnea (adult) (pediatric): Secondary | ICD-10-CM | POA: Diagnosis not present

## 2017-05-24 DIAGNOSIS — E1165 Type 2 diabetes mellitus with hyperglycemia: Secondary | ICD-10-CM | POA: Diagnosis not present

## 2017-05-24 MED ORDER — PIOGLITAZONE HCL 45 MG PO TABS: 45.0000 mg | ORAL_TABLET | Freq: Every day | ORAL | 3 refills | Status: DC

## 2017-05-24 MED ORDER — CYCLOBENZAPRINE HCL 10 MG PO TABS: 10.0000 mg | ORAL_TABLET | Freq: Three times a day (TID) | ORAL | 0 refills | Status: AC | PRN

## 2017-05-24 MED ORDER — MELOXICAM 7.5 MG PO TABS: 7.5000 mg | ORAL_TABLET | Freq: Every day | ORAL | 0 refills | Status: DC

## 2017-05-24 NOTE — Progress Notes (Signed)
   Subjective:    Patient ID: Kevin Yates, male    DOB: 01/03/1976, 41 y.o.   MRN: [id number]  HPI    Review of Systems  Constitutional: Negative for fever and unexpected weight change.  HENT: Negative for congestion, dental problem, ear pain, nosebleeds, postnasal drip, rhinorrhea, sinus pressure, sneezing, sore throat and trouble swallowing.   Eyes: Positive for pain. Negative for redness and itching.  Respiratory: Negative for cough, chest tightness, shortness of breath and wheezing.   Cardiovascular: Negative for palpitations and leg swelling.  Gastrointestinal: Negative for nausea and vomiting.  Genitourinary: Negative for dysuria.  Musculoskeletal: Negative for joint swelling.  Skin: Negative for rash.  Allergic/Immunologic: Positive for environmental allergies. Negative for food allergies and immunocompromised state.  Neurological: Positive for headaches.  Hematological: Does not bruise/bleed easily.  Psychiatric/Behavioral: Negative for dysphoric mood. The patient is not nervous/anxious.        Objective:   Physical Exam        Assessment & Plan:

## 2017-05-24 NOTE — Patient Instructions (Signed)
Will arrange for home sleep study Will call to arrange for follow up after sleep study reviewed  

## 2017-05-24 NOTE — Progress Notes (Signed)
Past Surgical History He  has no past surgical history on file.  Allergies  Allergen Reactions  . Metformin And Related Diarrhea    After one dose, developed abdominal pain and diarrhea---12/14/2015    Family History His family history includes Hypertension in his father and mother.  Social History He  reports that he has been smoking Cigarettes.  He has been smoking about 0.50 packs per day. He has never used smokeless tobacco. He reports that he drinks about 4.8 oz of alcohol per week . He reports that he does not use drugs.  Review of systems Constitutional: Negative for fever and unexpected weight change.  HENT: Negative for congestion, dental problem, ear pain, nosebleeds, postnasal drip, rhinorrhea, sinus pressure, sneezing, sore throat and trouble swallowing.   Eyes: Positive for pain. Negative for redness and itching.  Respiratory: Negative for cough, chest tightness, shortness of breath and wheezing.   Cardiovascular: Negative for palpitations and leg swelling.  Gastrointestinal: Negative for nausea and vomiting.  Genitourinary: Negative for dysuria.  Musculoskeletal: Negative for joint swelling.  Skin: Negative for rash.  Allergic/Immunologic: Positive for environmental allergies. Negative for food allergies and immunocompromised state.  Neurological: Positive for headaches.  Hematological: Does not bruise/bleed easily.  Psychiatric/Behavioral: Negative for dysphoric mood. The patient is not nervous/anxious.     Current Outpatient Prescriptions on File Prior to Visit  Medication Sig  . acetaZOLAMIDE (DIAMOX) 500 MG capsule Take 1 capsule (500 mg total) by mouth 2 (two) times daily.  . B-D UF III MINI PEN NEEDLES 31G X 5 MM MISC USE DAILY.  Marland Kitchen BAYER MICROLET LANCETS lancets   . Blood Glucose Monitoring Suppl (BLOOD GLUCOSE SYSTEM PAK) KIT Please dispense based on patient and insurance preference. Use as directed to monitor FSBS 3x daily. Dx: E11.65.  Marland Kitchen Glucose Blood (BLOOD  GLUCOSE TEST STRIPS) STRP Please dispense based on patient and insurance preference. Use as directed to monitor FSBS 3x daily. Dx: E11.65.  Marland Kitchen Insulin Detemir (LEVEMIR FLEXTOUCH) 100 UNIT/ML Pen Inject 50 Units into the skin daily at 10 pm.  . Lancet Devices MISC Please dispense based on patient and insurance preference. Use as directed to monitor FSBS 3x daily. Dx: E11.65.  Marland Kitchen lisinopril (PRINIVIL,ZESTRIL) 10 MG tablet TAKE 1 TABLET BY MOUTH EVERY DAY  . metoprolol succinate (TOPROL-XL) 25 MG 24 hr tablet TAKE 1 TABLET BY MOUTH EVERY DAY  . pioglitazone (ACTOS) 45 MG tablet Take 1 tablet (45 mg total) by mouth daily.  . simvastatin (ZOCOR) 10 MG tablet TAKE 1 TABLET BY MOUTH EVERYDAY AT BEDTIME   No current facility-administered medications on file prior to visit.     Chief Complaint  Patient presents with  . Sleep Consult    Pt referred by Dr. Metta Clines MD; sleep consult for OSA. Pt had sleep study last year, pt is sleeping around 4 hours each night, gets up around 3-4 times each night.     Sleep tests PSG 11/27/13 >> AHI 87.8,   Past medical history He  has a past medical history of Diabetes mellitus without complication ([place]) and Hypertension.  Vital signs BP 128/80 (BP Location: Left Arm, Cuff Size: Normal)   Pulse 96   Ht '5\' 9"'  (1.753 m)   Wt 240 lb (108.9 kg)   SpO2 98%   BMI 35.44 kg/m   History of Present Illness Kevin Yates is a 41 y.o. male for evaluation of sleep problems.  He had sleep study in 2015.  This showed severe sleep apnea.  He has not been on therapy.  He says he was never told about results of previous sleep study, and never had discussion about therapy options.    He was seen recently by neurology for papiledema.  There was concern his sleep apnea could be contributing to intracranial hypertension.  His wife has told him that he snores and that he stops breathing while asleep.  She has to sleep with ear plugs.  He notices waking up with a gasp if he  sleeps on his back.  His mouth gets dry at night.  He falls asleep easily if he is watching TV.  He goes to sleep at 11 pm.  He falls asleep within minutes.  He wakes up 3 or 4 times to use the bathroom.  He gets out of bed at 6 am.  He can't sleep later than this.  He feels okay in the morning, but gets more sleepy as the day goes on.  He denies morning headache.  He does not use anything to help him fall sleep or stay awake.  He denies sleep walking, sleep talking, bruxism, or nightmares.  There is no history of restless legs.  He denies sleep hallucinations, sleep paralysis, or cataplexy.  The Epworth score is 11 out of 24.  He smokes about 1/2 ppd.  He quit smoking for a while.  He just started a new job, and started smoking again.  He plans to quit again, and thinks he can do this on his own.  He gets sinus congestion and post nasal drip.  He has to take allegra during the Summer.  Physical Exam:  General - No distress Eyes - pupils reactive ENT - No sinus tenderness, no oral exudate, no LAN, no thyromegaly, TM clear, pupils equal/reactive, MP 4, scalloped tongue, narrow jaw Cardiac - s1s2 regular, no murmur, pulses symmetric Chest - No wheeze/rales/dullness, good air entry, normal respiratory excursion Back - No focal tenderness Abd - Soft, non-tender, no organomegaly, + bowel sounds Ext - No edema Neuro - Normal strength, cranial nerves intact Skin - No rashes Psych - Normal mood, and behavior  Discussion: He has snoring, sleep disruption, witnessed apnea, and daytime sleepiness.  His BMI is > 35.  He has hx of hypertension and diabetes.  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  His previous sleep study showed severe sleep apnea.  It appears that he was never informed of test results previously.  I am almost certain he still has sleep apnea, but need to determine current status.  We discussed how sleep apnea can affect various health problems, including risks for hypertension,  cardiovascular disease, and diabetes.  We also discussed how sleep disruption can increase risks for accidents, such as while driving.  Weight loss as a means of improving sleep apnea was also reviewed.  Additional treatment options discussed were CPAP therapy, oral appliance, and surgical intervention.  Assessment/plan:  Obstructive sleep apnea. - will arrange for home sleep study to assess current status - assuming he still has sleep apnea, will then arrange for auto CPAP after sleep study reviewed  Nasal congestion. - advised him to try using nasal irrigation - he can continue prn OTC antihistamine  Tobacco abuse. - discussed options to assist with smoking cessation - he will try quitting on his own again  Obesity. - discussed options to assist with weight loss   Patient Instructions  Will arrange for home sleep study Will call to arrange for follow up after sleep study reviewed  [name], M.D. Pager [phone] 05/24/2017, 9:45 AM
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
## 2017-06-01 ENCOUNTER — Ambulatory Visit (INDEPENDENT_AMBULATORY_CARE_PROVIDER_SITE_OTHER): Payer: BLUE CROSS/BLUE SHIELD | Admitting: Neurology

## 2017-06-01 ENCOUNTER — Encounter: Payer: Self-pay | Admitting: Neurology

## 2017-06-01 ENCOUNTER — Other Ambulatory Visit: Payer: BLUE CROSS/BLUE SHIELD

## 2017-06-01 VITALS — BP 136/86 | HR 88 | Ht 69.0 in | Wt 231.6 lb

## 2017-06-01 DIAGNOSIS — G932 Benign intracranial hypertension: Secondary | ICD-10-CM

## 2017-06-01 DIAGNOSIS — G4733 Obstructive sleep apnea (adult) (pediatric): Secondary | ICD-10-CM | POA: Diagnosis not present

## 2017-06-01 DIAGNOSIS — Z79899 Other long term (current) drug therapy: Secondary | ICD-10-CM

## 2017-06-01 DIAGNOSIS — M545 Low back pain, unspecified: Secondary | ICD-10-CM

## 2017-06-01 DIAGNOSIS — I10 Essential (primary) hypertension: Secondary | ICD-10-CM

## 2017-06-01 LAB — BASIC METABOLIC PANEL
BUN: 14 mg/dL (ref 7–25)
CALCIUM: 8.9 mg/dL (ref 8.6–10.3)
CO2: 19 mmol/L — AB (ref 20–32)
Chloride: 111 mmol/L — ABNORMAL HIGH (ref 98–110)
Creat: 1.3 mg/dL (ref 0.60–1.35)
GLUCOSE: 84 mg/dL (ref 65–99)
Potassium: 4.2 mmol/L (ref 3.5–5.3)
SODIUM: 139 mmol/L (ref 135–146)

## 2017-06-01 NOTE — Patient Instructions (Signed)
1.  Continue acetazolamide ER 500mg  twice daily.  Will check a basic metabolic panel 2.  Refer to Dr. Alben Spittle at [facility] 3.  Follow up with Dr. Craige Cotta regarding sleep study 4.  Congratulations about quitting smoking 5.  Follow up in 3 months.

## 2017-06-01 NOTE — Progress Notes (Signed)
NEUROLOGY FOLLOW UP OFFICE NOTE  Babak Lucus [id number]  HISTORY OF PRESENT ILLNESS: Kevin Yates is a 41 year old right-handed African American male with hypertension, type 2 diabetes and hyperlipidemia who follows up for idiopathic intracranial hypertension.  UPDATE: Last visit in July, he was started on acetazolamide ER 53m twice daily.  He was also referred to a sleep specialist (Dr. VChesley Mires for evaluation and treatment of OSA.  He has a home sleep study scheduled.  He is still asymptomatic.  He has quit smoking.  He has lost 6 lbs.  He hasn't followed up for an eye exam.  Since starting the acetazolamide, he states that sometimes his stool is loose or green, but it is not all the time.  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  He is referred to neurology for evaluation but never started on therapy.  In 2011, [place] GLenward Chancellor for further evaluation.  MRI and MRV of the brain were reportedly unremarkable.  Lumbar puncture revealed an opening pressure of 340 mmH2O.  CSF analysis was unremarkable.  He was actually started on acetazolamide 2557mtwice daily, which did not improve disc edema.  He was found to have sleep apnea, and it was suspected that the untreated sleep apnea was contributing to the intracranial hypertension.    He never followed up for treatment.  He recently had an eye exam by a new eye doctor and is now sent to me for further evaluation.   He denies headache, visual obscuration or pulsatile tinnitus.  I do not have notes from his eye doctor, but he says he did not demonstrate visual field loss.  PAST MEDICAL HISTORY: Past Medical History:  Diagnosis Date  . Diabetes mellitus without complication ([place]  . Hypertension     MEDICATIONS: Current Outpatient Prescriptions on File Prior to Visit  Medication Sig Dispense Refill  . acetaZOLAMIDE (DIAMOX) 500 MG capsule Take 1 capsule (500 mg  total) by mouth 2 (two) times daily. 60 capsule 2  . B-D UF III MINI PEN NEEDLES 31G X 5 MM MISC USE DAILY. 100 each 1  . BAYER MICROLET LANCETS lancets   10  . Blood Glucose Monitoring Suppl (BLOOD GLUCOSE SYSTEM PAK) KIT Please dispense based on patient and insurance preference. Use as directed to monitor FSBS 3x daily. Dx: E11.65. 1 each 1  . cyclobenzaprine (FLEXERIL) 10 MG tablet Take 1 tablet (10 mg total) by mouth 3 (three) times daily as needed for muscle spasms. 30 tablet 0  . Glucose Blood (BLOOD GLUCOSE TEST STRIPS) STRP Please dispense based on patient and insurance preference. Use as directed to monitor FSBS 3x daily. Dx: E11.65. 100 each 11  . Insulin Detemir (LEVEMIR FLEXTOUCH) 100 UNIT/ML Pen Inject 50 Units into the skin daily at 10 pm. 5 pen 5  . Lancet Devices MISC Please dispense based on patient and insurance preference. Use as directed to monitor FSBS 3x daily. Dx: E11.65. 1 each 1  . lisinopril (PRINIVIL,ZESTRIL) 10 MG tablet TAKE 1 TABLET BY MOUTH EVERY DAY 30 tablet 2  . meloxicam (MOBIC) 7.5 MG tablet Take 1 tablet (7.5 mg total) by mouth daily. 30 tablet 0  . metoprolol succinate (TOPROL-XL) 25 MG 24 hr tablet TAKE 1 TABLET BY MOUTH EVERY DAY 30 tablet 2  . pioglitazone (ACTOS) 45 MG tablet Take 1 tablet (45 mg total) by mouth daily. 90 tablet 3  . simvastatin (  ZOCOR) 10 MG tablet TAKE 1 TABLET BY MOUTH EVERYDAY AT BEDTIME 30 tablet 1   No current facility-administered medications on file prior to visit.     ALLERGIES: Allergies  Allergen Reactions  . Metformin And Related Diarrhea    After one dose, developed abdominal pain and diarrhea---12/14/2015    FAMILY HISTORY: Family History  Problem Relation Age of Onset  . Hypertension Mother   . Hypertension Father     SOCIAL HISTORY: Social History   Social History  . Marital status: Married    Spouse name: N/A  . Number of children: 1  . Years of education: 8   Occupational History  . Not on file.    Social History Main Topics  . Smoking status: Former Smoker    Packs/day: 0.50    Types: Cigarettes    Quit date: 05/14/2017  . Smokeless tobacco: Never Used  . Alcohol use 4.8 oz/week    8 Standard drinks or equivalent per week     Comment: mostly on weekend  . Drug use: No  . Sexual activity: Yes    Partners: Female   Other Topics Concern  . Not on file   Social History Narrative   Lives with wife and daughter in a 2 story home.  Has one daughter.  Works on used cars.  Education: college.    REVIEW OF SYSTEMS: Constitutional: No fevers, chills, or sweats, no generalized fatigue, change in appetite Eyes: No visual changes, double vision, eye pain Ear, nose and throat: No hearing loss, ear pain, nasal congestion, sore throat Cardiovascular: No chest pain, palpitations Respiratory:  No shortness of breath at rest or with exertion, wheezes GastrointestinaI: No nausea, vomiting, diarrhea, abdominal pain, fecal incontinence Genitourinary:  No dysuria, urinary retention or frequency Musculoskeletal:  No neck pain, back pain Integumentary: No rash, pruritus, skin lesions Neurological: as above Psychiatric: No depression, insomnia, anxiety Endocrine: No palpitations, fatigue, diaphoresis, mood swings, change in appetite, change in weight, increased thirst Hematologic/Lymphatic:  No purpura, petechiae. Allergic/Immunologic: no itchy/runny eyes, nasal congestion, recent allergic reactions, rashes  PHYSICAL EXAM: Vitals:   06/01/17 0849  BP: 136/86  Pulse: 88  SpO2: 99%   General: No acute distress.  Patient appears well-groomed.   Head:  Normocephalic/atraumatic Eyes:  Fundi examined but not visualized Neck: supple, no paraspinal tenderness, full range of motion Heart:  Regular rate and rhythm Lungs:  Clear to auscultation bilaterally Back: No paraspinal tenderness Neurological Exam: alert and oriented to person, place, and time. Attention span and concentration intact,  recent and remote memory intact, fund of knowledge intact.  Speech fluent and not dysarthric, language intact.  CN II-XII intact. Bulk and tone normal, muscle strength 5/5 throughout.  Sensation to light touch, temperature and vibration intact.  Deep tendon reflexes 2+ throughout, toes downgoing.  Finger to nose and heel to shin testing intact.  Gait normal, Romberg negative.  IMPRESSION: Idiopathic intracranial hypertension Possible OSA Hypertension  PLAN: Continue acetazolamide ER 57m twice daily.  Check BMP. Follow up for sleep study as per Dr. SHalford ChessmanRefer to Dr. WKathlen [facility] Continue blood pressure control Follow up with me in 3 months.  16 minutes spent face to face with patient, over 50% spent discussing management.  AMetta Clines DO  CC:  MDena Billet PA-C

## 2017-06-10 ENCOUNTER — Other Ambulatory Visit: Payer: Self-pay | Admitting: Neurology

## 2017-06-19 ENCOUNTER — Other Ambulatory Visit: Payer: Self-pay

## 2017-06-19 MED ORDER — INSULIN DETEMIR 100 UNIT/ML FLEXPEN: 50.0000 [IU] | PEN_INJECTOR | Freq: Every day | SUBCUTANEOUS | 5 refills | Status: AC

## 2017-06-20 ENCOUNTER — Encounter: Payer: Self-pay | Admitting: Pulmonary Disease

## 2017-06-20 DIAGNOSIS — G4733 Obstructive sleep apnea (adult) (pediatric): Secondary | ICD-10-CM | POA: Diagnosis not present

## 2017-06-21 ENCOUNTER — Telehealth: Payer: Self-pay | Admitting: Pulmonary Disease

## 2017-06-21 DIAGNOSIS — G4733 Obstructive sleep apnea (adult) (pediatric): Secondary | ICD-10-CM | POA: Diagnosis not present

## 2017-06-21 NOTE — Telephone Encounter (Signed)
HST 06/20/17 >> AHI 46.9, SaO2 low 56%.   Will have my nurse inform pt that sleep study shows severe sleep apnea.  Options are 1) CPAP now, 2) ROV first.  If He is agreeable to CPAP, then please send order for auto CPAP range 5 to 15 cm H2O with heated humidity and mask of choice.  Have download sent 1 month after starting CPAP and set up ROV 2 months after starting CPAP.  ROV can be with me or NP.

## 2017-06-22 ENCOUNTER — Other Ambulatory Visit: Payer: Self-pay | Admitting: *Deleted

## 2017-06-22 DIAGNOSIS — G4733 Obstructive sleep apnea (adult) (pediatric): Secondary | ICD-10-CM

## 2017-06-27 NOTE — Telephone Encounter (Signed)
Pt returning call about sleep study.-tr

## 2017-06-27 NOTE — Telephone Encounter (Signed)
Left voice mail on machine for patient to return phone call back regarding results and recommendations of sleep study. Will follow up again with patient at later date.

## 2017-06-28 ENCOUNTER — Other Ambulatory Visit: Payer: Self-pay | Admitting: Physician Assistant

## 2017-06-28 DIAGNOSIS — M545 Low back pain, unspecified: Secondary | ICD-10-CM

## 2017-06-28 NOTE — Telephone Encounter (Signed)
Pt is aware of results and voiced his understanding.  Pt agreed to cpap therapy. Order has been placed. Pt has been scheduled for 76mo ROV on 08/31/16 with VS. Nothing further needed.

## 2017-06-28 NOTE — Telephone Encounter (Signed)
Patient returning call for HST, [phone].

## 2017-06-28 NOTE — Telephone Encounter (Signed)
Refill appropriate 

## 2017-07-17 ENCOUNTER — Encounter (HOSPITAL_COMMUNITY): Payer: Self-pay | Admitting: *Deleted

## 2017-07-17 ENCOUNTER — Emergency Department (HOSPITAL_COMMUNITY): Payer: BLUE CROSS/BLUE SHIELD

## 2017-07-17 ENCOUNTER — Other Ambulatory Visit: Payer: Self-pay

## 2017-07-17 ENCOUNTER — Emergency Department (HOSPITAL_COMMUNITY)
Admission: EM | Admit: 2017-07-17 | Discharge: 2017-07-17 | Disposition: A | Discharge status: Home / Self Care | Payer: BLUE CROSS/BLUE SHIELD | Attending: Emergency Medicine | Admitting: Emergency Medicine

## 2017-07-17 DIAGNOSIS — Z79899 Other long term (current) drug therapy: Secondary | ICD-10-CM | POA: Insufficient documentation

## 2017-07-17 DIAGNOSIS — I10 Essential (primary) hypertension: Secondary | ICD-10-CM | POA: Diagnosis not present

## 2017-07-17 DIAGNOSIS — Z794 Long term (current) use of insulin: Secondary | ICD-10-CM | POA: Diagnosis not present

## 2017-07-17 DIAGNOSIS — J40 Bronchitis, not specified as acute or chronic: Secondary | ICD-10-CM | POA: Diagnosis not present

## 2017-07-17 DIAGNOSIS — E119 Type 2 diabetes mellitus without complications: Secondary | ICD-10-CM | POA: Insufficient documentation

## 2017-07-17 DIAGNOSIS — R0602 Shortness of breath: Secondary | ICD-10-CM | POA: Diagnosis present

## 2017-07-17 DIAGNOSIS — Z87891 Personal history of nicotine dependence: Secondary | ICD-10-CM | POA: Insufficient documentation

## 2017-07-17 LAB — CBC
HCT: 41.8 % (ref 39.0–52.0)
Hemoglobin: 13.6 g/dL (ref 13.0–17.0)
MCH: 26.2 pg (ref 26.0–34.0)
MCHC: 32.5 g/dL (ref 30.0–36.0)
MCV: 80.5 fL (ref 78.0–100.0)
PLATELETS: 235 10*3/uL (ref 150–400)
RBC: 5.19 MIL/uL (ref 4.22–5.81)
RDW: 17.4 % — AB (ref 11.5–15.5)
WBC: 6.1 10*3/uL (ref 4.0–10.5)

## 2017-07-17 LAB — BASIC METABOLIC PANEL
Anion gap: 10 (ref 5–15)
BUN: 10 mg/dL (ref 6–20)
CALCIUM: 8.8 mg/dL — AB (ref 8.9–10.3)
CO2: 21 mmol/L — AB (ref 22–32)
CREATININE: 1.2 mg/dL (ref 0.61–1.24)
Chloride: 108 mmol/L (ref 101–111)
GFR calc non Af Amer: >60 mL/min (ref >60)
Glucose, Bld: 177 mg/dL — ABNORMAL HIGH (ref 65–99)
Potassium: 3.7 mmol/L (ref 3.5–5.1)
SODIUM: 139 mmol/L (ref 135–145)

## 2017-07-17 LAB — I-STAT TROPONIN, ED
TROPONIN I, POC: 0 ng/mL (ref 0.00–0.08)
Troponin i, poc: 0 ng/mL (ref 0.00–0.08)

## 2017-07-17 LAB — BRAIN NATRIURETIC PEPTIDE: B Natriuretic Peptide: 275.2 pg/mL — ABNORMAL HIGH (ref 0.0–100.0)

## 2017-07-17 MED ORDER — IPRATROPIUM-ALBUTEROL 0.5-2.5 (3) MG/3ML IN SOLN
3.0000 mL | Freq: Once | RESPIRATORY_TRACT | Status: AC
  Administered 2017-07-17: 3 mL via RESPIRATORY_TRACT
  Filled 2017-07-17: qty 3

## 2017-07-17 MED ORDER — ALBUTEROL SULFATE HFA 108 (90 BASE) MCG/ACT IN AERS
2.0000 | INHALATION_SPRAY | Freq: Four times a day (QID) | RESPIRATORY_TRACT | Status: DC
  Administered 2017-07-17: 2 via RESPIRATORY_TRACT
  Filled 2017-07-17: qty 6.7

## 2017-07-17 MED ORDER — AEROCHAMBER PLUS FLO-VU MEDIUM MISC
1.0000 | Freq: Once | Status: AC
  Administered 2017-07-17: 1
  Filled 2017-07-17: qty 1

## 2017-07-17 MED ORDER — AEROCHAMBER PLUS FLO-VU LARGE MISC
Status: AC
  Administered 2017-07-17: 16:00:00
  Filled 2017-07-17: qty 1

## 2017-07-17 MED ORDER — GUAIFENESIN ER 1200 MG PO TB12: 1.0000 | ORAL_TABLET | Freq: Two times a day (BID) | ORAL | 0 refills | Status: DC

## 2017-07-17 NOTE — ED Triage Notes (Signed)
Pt reports waking up this am with sob, gasping for air. Denies recent cough or swelling to legs.

## 2017-07-17 NOTE — Discharge Instructions (Signed)
Return here as needed.  Follow-up as soon as possible with your primary care doctor.  Your testing here today did not show any significant abnormality.  Your x-ray did show some mild bronchitic changes which may be causing some of the issue for you as this is related to bronchitis

## 2017-07-18 NOTE — ED Provider Notes (Signed)
[facility] EMERGENCY DEPARTMENT Provider Note   CSN: [id number] Arrival date & time: 07/17/17  8182     History   Chief Complaint Chief Complaint  Patient presents with  . Shortness of Breath    HPI Kevin Yates is a 41 y.o. male.  HPI Patient presents to the emergency department with waking this morning at 4 AM with shortness of breath some coughing and felt like he was having to take deep breaths.  Patient states he supposed to be using CPAP but does not use the machine and has not for quite a while.  Patient states that he has had some mild intermittent coughing over the last 2 days.  Patient states nothing seemed to make the condition better or worse he states that this episode only lasted for very short time.  He did not have any chest pain.  The patient denies chest pain,  headache,blurred vision, neck pain, fever,  weakness, numbness, dizziness, anorexia, edema, abdominal pain, nausea, vomiting, diarrhea, rash, back pain, dysuria, hematemesis, bloody stool, near syncope, or syncope. Past Medical History:  Diagnosis Date  . Diabetes mellitus without complication ([place])   . Hypertension     Patient Active Problem List   Diagnosis Date Noted  . OSA (obstructive sleep apnea) 06/21/2017  . Hyperlipidemia 02/22/2017  . 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 Bleeding external hemorrhoids 12/16/2015  . Diabetes mellitus type 2, uncomplicated ([place]) 99/37/1696  . Essential hypertension 12/15/2015    History reviewed. No pertinent surgical history.     Home Medications    Prior to Admission medications   Medication Sig Start Date End Date Taking? Authorizing Provider  acetaZOLAMIDE (DIAMOX) 500 MG capsule TAKE 1 CAPSULE BY MOUTH TWICE A DAY 06/11/17  Yes Tomi Likens, Adam R, DO  aspirin-acetaminophen-caffeine (EXCEDRIN MIGRAINE) [phone] MG tablet Take 1 tablet by mouth every 6 (six) hours as needed for headache.   Yes [provider]  B-D UF III MINI  PEN NEEDLES 31G X 5 MM MISC USE DAILY. 04/09/17  Yes Orlena Sheldon, PA-C  BAYER MICROLET LANCETS lancets  12/24/15  Yes [provider]  Blood Glucose Monitoring Suppl (BLOOD GLUCOSE SYSTEM PAK) KIT Please dispense based on patient and insurance preference. Use as directed to monitor FSBS 3x daily. Dx: E11.65. 12/24/15  Yes Orlena Sheldon, PA-C  Glucose Blood (BLOOD GLUCOSE TEST STRIPS) STRP Please dispense based on patient and insurance preference. Use as directed to monitor FSBS 3x daily. Dx: E11.65. 12/24/15  Yes Orlena Sheldon, PA-C  Insulin Detemir (LEVEMIR FLEXTOUCH) 100 UNIT/ML Pen Inject 50 Units daily at 10 pm into the skin. 06/19/17  Yes Orlena Sheldon, PA-C  Lancet Devices MISC Please dispense based on patient and insurance preference. Use as directed to monitor FSBS 3x daily. Dx: E11.65. 12/24/15  Yes Dena Billet B, PA-C  lisinopril (PRINIVIL,ZESTRIL) 10 MG tablet TAKE 1 TABLET BY MOUTH EVERY DAY 05/07/17  Yes Dena Billet B, PA-C  metoprolol succinate (TOPROL-XL) 25 MG 24 hr tablet TAKE 1 TABLET BY MOUTH EVERY DAY 05/07/17  Yes Dena Billet B, PA-C  pioglitazone (ACTOS) 45 MG tablet Take 1 tablet (45 mg total) by mouth daily. 05/24/17  Yes Dena Billet B, PA-C  simvastatin (ZOCOR) 10 MG tablet TAKE 1 TABLET BY MOUTH EVERYDAY AT BEDTIME 05/07/17  Yes Dena Billet B, PA-C  cyclobenzaprine (FLEXERIL) 10 MG tablet Take 1 tablet (10 mg total) by mouth 3 (three) times daily as needed for muscle spasms. Patient not taking: Reported  on 07/17/2017 05/24/17   Orlena Sheldon, PA-C  Guaifenesin 1200 MG TB12 Take 1 tablet (1,200 mg total) by mouth 2 (two) times daily. 07/17/17   Cyra Spader, Harrell Gave, PA-C  meloxicam (MOBIC) 7.5 MG tablet TAKE 1 TABLET BY MOUTH EVERY DAY Patient not taking: Reported on 07/17/2017 06/28/17   Orlena Sheldon, PA-C    Family History Family History  Problem Relation Age of Onset  . Hypertension Mother   . Hypertension Father     Social History Social History   Tobacco Use    . Smoking status: Former Smoker    Packs/day: 0.50    Types: Cigarettes    Last attempt to quit: 05/14/2017    Years since quitting: 0.1  . Smokeless tobacco: Never Used  Substance Use Topics  . Alcohol use: Yes    Alcohol/week: 4.8 oz    Types: 8 Standard drinks or equivalent per week    Comment: mostly on weekend  . Drug use: No     Allergies   Metformin and related   Review of Systems Review of Systems All other systems negative except as documented in the HPI. All pertinent positives and negatives as reviewed in the HPI.  Physical Exam Updated Vital Signs BP (!) 158/94 (BP Location: Right Arm)   Pulse 95   Temp 98.5 F (36.9 C) (Oral)   Resp 15   Ht '5\' 9"'  (1.753 m)   Wt 104.3 kg (230 lb)   SpO2 95%   BMI 33.97 kg/m   Physical Exam  Constitutional: He is oriented to person, place, and time. He appears well-developed and well-nourished. No distress.  HENT:  Head: Normocephalic and atraumatic.  Mouth/Throat: Oropharynx is clear and moist.  Eyes: Pupils are equal, round, and reactive to light.  Neck: Normal range of motion. Neck supple.  Cardiovascular: Normal rate, regular rhythm and normal heart sounds. Exam reveals no gallop and no friction rub.  No murmur heard. Pulmonary/Chest: Effort normal and breath sounds normal. No accessory muscle usage or stridor. No apnea, no tachypnea and no bradypnea. No respiratory distress. He has no decreased breath sounds. He has no rhonchi. He has no rales.  Patient does have an occasional wheeze in the bases.  Abdominal: Soft. Bowel sounds are normal. He exhibits no distension. There is no tenderness.  Neurological: He is alert and oriented to person, place, and time. He exhibits normal muscle tone. Coordination normal.  Skin: Skin is warm and dry. Capillary refill takes less than 2 seconds. No rash noted. No erythema.  Psychiatric: He has a normal mood and affect. His behavior is normal.  Nursing note and vitals  reviewed.    ED Treatments / Results  Labs (all labs ordered are listed, but only abnormal results are displayed) Labs Reviewed  BASIC METABOLIC PANEL - Abnormal; Notable for the following components:      Result Value   CO2 21 (*)    Glucose, Bld 177 (*)    Calcium 8.8 (*)    All other components within normal limits  CBC - Abnormal; Notable for the following components:   RDW 17.4 (*)    All other components within normal limits  BRAIN NATRIURETIC PEPTIDE - Abnormal; Notable for the following components:   B Natriuretic Peptide 275.2 (*)    All other components within normal limits  I-STAT TROPONIN, ED  I-STAT TROPONIN, ED    EKG  EKG Interpretation  Date/Time:  Tuesday July 17 2017 07:38:20 EST Ventricular Rate:  95 PR  Interval:  196 QRS Duration: 148 QT Interval:  410 QTC Calculation: 515 R Axis:   79 Text Interpretation:  Normal sinus rhythm Biatrial enlargement Left bundle branch block Abnormal ECG No old tracing to compare Confirmed by Jola Schmidt [phone]) on 07/17/2017 1:51:00 PM       Radiology Dg Chest 2 View  Result Date: 07/17/2017 CLINICAL DATA:  Shortness of Breath EXAM: CHEST  2 VIEW COMPARISON:  None. FINDINGS: Mild hyperinflation of the lungs, peribronchial thickening. Heart is upper limits normal in size. No confluent opacities or effusions. No acute bony abnormality. IMPRESSION: Mild hyperinflation and bronchitic changes. Electronically Signed   By: Rolm Baptise M.D.   On: 07/17/2017 08:24    Procedures Procedures (including critical care time)  Medications Ordered in ED Medications  ipratropium-albuterol (DUONEB) 0.5-2.5 (3) MG/3ML nebulizer solution 3 mL (3 mLs Nebulization Given 07/17/17 1357)  AEROCHAMBER PLUS FLO-VU MEDIUM MISC 1 each (1 each Other Given 07/17/17 1530)  AEROCHAMBER PLUS FLO-VU LARGE MISC (  Given 07/17/17 1534)     Initial Impression / Assessment and Plan / ED Course  I have reviewed the triage vital signs and the  nursing notes.  Pertinent labs & imaging results that were available during my care of the patient were reviewed by me and considered in my medical decision making (see chart for details).    The patient may have a little bronchitis that has been exacerbated by his sleep apnea.  Patient was given breathing treatment here in the emergency department due to his occasional wheeze patient's chest x-ray findings also show that he has bronchitic changes.  Patient is having no symptoms of not had any symptoms since that episode this morning.  I did advise the patient to follow-up with his primary care doctor.  Told to return here for any worsening in his condition.  Final Clinical Impressions(s) / ED Diagnoses   Final diagnoses:  Bronchitis    ED Discharge Orders        Ordered    Guaifenesin 1200 MG TB12  2 times daily     07/17/17 [address] PA-C 07/18/17 [place], MD 07/19/17 [phone]

## 2017-08-08 ENCOUNTER — Telehealth: Payer: Self-pay | Admitting: Pulmonary Disease

## 2017-08-08 NOTE — Telephone Encounter (Signed)
Rec'd a email from Vickie Epley with [employer] on 08-02-17 advising that this patient should be using CPAP machine, and is in need of new cpap set up. She spoke with the patient on 07-06-2017 and patient advised that he would call back after going over financials with his wife and he advised at that time that he did not think they could afford the CPAP machine.   Heather with [employer] has attempted to call the patient three times for follow up and no answer and did leave voice messages each time with no return calls. At this time Herbert Seta with [employer] is voidin gthe sales order for CPAP machine.   Routing message to [facility] and [facility] for review.

## 2017-08-08 NOTE — Telephone Encounter (Signed)
Noted.  Will discuss in more detail during his visit in January.

## 2017-08-31 ENCOUNTER — Ambulatory Visit: Payer: BLUE CROSS/BLUE SHIELD | Admitting: Pulmonary Disease

## 2017-08-31 ENCOUNTER — Encounter: Payer: Self-pay | Admitting: Pulmonary Disease

## 2017-08-31 VITALS — BP 132/90 | HR 96 | Ht 69.0 in | Wt 226.0 lb

## 2017-08-31 DIAGNOSIS — G4733 Obstructive sleep apnea (adult) (pediatric): Secondary | ICD-10-CM | POA: Diagnosis not present

## 2017-08-31 NOTE — Patient Instructions (Signed)
Can look up CPAP machine options at CPAP.com or similar website  Call or email once your find a machine you would like to try  Follow up in 4 months

## 2017-08-31 NOTE — Progress Notes (Signed)
[facility]  Chief Complaint  Patient presents with  . Follow-up    Pt has not used cpap machine in years; have the machine over 13 yrs. In need of new machine; pt states that has to pay insurance monthly plus deductiable. Pt cannot afford it at this time.    Vital signs: BP 132/90 (BP Location: Left Arm, Cuff Size: Normal)   Pulse 96   Ht '5\' 9"'$  (1.753 m)   Wt 226 lb (102.5 kg)   SpO2 98%   BMI 33.37 kg/m   History of Present Illness: Cortney Yates is a 42 y.o. male obstructive sleep apnea.  He had home sleep study.  Showed severe sleep apnea.  He was told by insurance that he had $500 deductible, and would have to pay $100 per month for a CPAP.  He can't afford this.  He is still having snoring, sleep disruption, apnea, and falls asleep easily when sitting quiet.  Physical Exam:  General - pleasant Eyes - pupils reactive, wears glasses ENT - no sinus tenderness, no oral exudate, no LAN, MP 4, scalloped tongue, narrow jaw Cardiac - regular, no murmur Chest - no wheeze, rales Abd - soft, non tender Ext - no edema Skin - no rashes Neuro - normal strength Psych - normal mood  Assessment/Plan:  Obstructive sleep apnea. - will have him look up CPAP machine options on line, and then likely he will purchase machine on his own - will then try to get replacement supplies set up through insurance - if this doesn't work, then another option would be to set up oral appliance   Patient Instructions  Can look up CPAP machine options at CPAP.com or similar website  Call or email once your find a machine you would like to try  Follow up in 4 months    Kevin Mires, MD [place] 08/31/2017, 10:29 AM Pager:  [phone]  Flow Sheet  Sleep tests: PSG 11/27/13 >> AHI 87.8 HST 06/20/17 >> AHI 46.9, SaO2 low 56%.  Past Medical History: He  has a past medical history of Diabetes mellitus without complication ([place])  and Hypertension.  Past Surgical History: He  has no past surgical history on file.  Family History: His family history includes Hypertension in his father and mother.  Social History: He  reports that he quit smoking about 3 months ago. His smoking use included cigarettes. He smoked 0.50 packs per day. he has never used smokeless tobacco. He reports that he drinks about 4.8 oz of alcohol per week. He reports that he does not use drugs.  Medications: Allergies as of 08/31/2017      Reactions   Metformin And Related Diarrhea   After one dose, developed abdominal pain and diarrhea---12/14/2015      Medication List        Accurate as of 08/31/17 10:29 AM. Always use your most recent med list.          acetaZOLAMIDE 500 MG capsule Commonly known as:  DIAMOX TAKE 1 CAPSULE BY MOUTH TWICE A DAY   aspirin-acetaminophen-caffeine 098-119-14 MG tablet Commonly known as:  EXCEDRIN MIGRAINE Take 1 tablet by mouth every 6 (six) hours as needed for headache.   B-D UF III MINI PEN NEEDLES 31G X 5 MM Misc Generic drug:  Insulin Pen Needle USE DAILY.   BAYER MICROLET LANCETS lancets   Blood Glucose System Pak Kit Please dispense based on patient and insurance preference. Use as directed to  monitor FSBS 3x daily. Dx: E11.65.   BLOOD GLUCOSE TEST STRIPS Strp Please dispense based on patient and insurance preference. Use as directed to monitor FSBS 3x daily. Dx: E11.65.   cyclobenzaprine 10 MG tablet Commonly known as:  FLEXERIL Take 1 tablet (10 mg total) by mouth 3 (three) times daily as needed for muscle spasms.   Guaifenesin 1200 MG Tb12 Take 1 tablet (1,200 mg total) by mouth 2 (two) times daily.   Insulin Detemir 100 UNIT/ML Pen Commonly known as:  LEVEMIR FLEXTOUCH Inject 50 Units daily at 10 pm into the skin.   Lancet Devices Misc Please dispense based on patient and insurance preference. Use as directed to monitor FSBS 3x daily. Dx: E11.65.   lisinopril 10 MG  tablet Commonly known as:  PRINIVIL,ZESTRIL TAKE 1 TABLET BY MOUTH EVERY DAY   meloxicam 7.5 MG tablet Commonly known as:  MOBIC TAKE 1 TABLET BY MOUTH EVERY DAY   metoprolol succinate 25 MG 24 hr tablet Commonly known as:  TOPROL-XL TAKE 1 TABLET BY MOUTH EVERY DAY   pioglitazone 45 MG tablet Commonly known as:  ACTOS Take 1 tablet (45 mg total) by mouth daily.   simvastatin 10 MG tablet Commonly known as:  ZOCOR TAKE 1 TABLET BY MOUTH EVERYDAY AT BEDTIME

## 2017-09-08 ENCOUNTER — Encounter

## 2017-09-21 ENCOUNTER — Ambulatory Visit: Payer: BLUE CROSS/BLUE SHIELD | Admitting: Neurology

## 2017-09-21 ENCOUNTER — Encounter: Payer: Self-pay | Admitting: Neurology

## 2017-09-21 VITALS — BP 144/92 | HR 97 | Ht 70.0 in | Wt 222.8 lb

## 2017-09-21 DIAGNOSIS — I10 Essential (primary) hypertension: Secondary | ICD-10-CM

## 2017-09-21 DIAGNOSIS — G4733 Obstructive sleep apnea (adult) (pediatric): Secondary | ICD-10-CM

## 2017-09-21 DIAGNOSIS — H47333 Pseudopapilledema of optic disc, bilateral: Secondary | ICD-10-CM

## 2017-09-21 MED ORDER — ACETAZOLAMIDE ER 500 MG PO CP12: 500.0000 mg | ORAL_CAPSULE | Freq: Two times a day (BID) | ORAL | 2 refills | Status: DC

## 2017-09-21 NOTE — Patient Instructions (Addendum)
At this point, it doesn't appear that you have increased pressure in the head.  Continue acetazolamide 500mg  twice daily for now.  We will see what the vision testing shows next month and I will contact you about whether you need to continue the medication and follow up or if it is okay to stop.  Start using CPAP for sleep apnea.

## 2017-09-21 NOTE — Progress Notes (Signed)
NEUROLOGY FOLLOW UP OFFICE NOTE  [place] [id number]  HISTORY OF PRESENT ILLNESS: Kevin Yates is a 42 year old right-handed African American male with hypertension, type 2 diabetes and hyperlipidemia who follows up for idiopathic intracranial hypertension.   UPDATE: He is taking acetazolamide ER 588m twice daily.   He is feeling well.  No headache or visual changes. He followed up with ophthalmologist Dr. WKathlen Modyon 09/03/17.  Findings revealed pseudopapilledema.  Plan is for him to return for HVF testing.  He followed up with Dr. SHalford Chessmanof pulmonary/sleep medicine.  Home sleep study confirmed OSA.  He is reviewing CPAP options.   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  He is referred to neurology for evaluation but never started on therapy.  In 2011, [place] GLenward Chancellor for further evaluation.  MRI and MRV of the brain were reportedly unremarkable.  Lumbar puncture revealed an opening pressure of 340 mmH2O.  CSF analysis was unremarkable.  He was actually started on acetazolamide 2575mtwice daily, which did not improve disc edema.  He was found to have sleep apnea, and it was suspected that the untreated sleep apnea was contributing to the intracranial hypertension.    He never followed up for treatment.  He recently had an eye exam by a new eye doctor and is now sent to me for further evaluation.   He denies headache, visual obscuration or pulsatile tinnitus.  I do not have notes from his eye doctor, but he says he did not demonstrate visual field loss.  History of papilledema with reported prior CSF opening pressure of 340 mmH2O.  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  If HVFs and optic nerves are stable at follow up with Dr. WeKathlen Modythen would discontinue acetazolamide.  PAST MEDICAL HISTORY: Past Medical History:  Diagnosis Date  . Diabetes mellitus without  complication ([place]  . Hypertension     MEDICATIONS: Current Outpatient Medications on File Prior to Visit  Medication Sig Dispense Refill  . aspirin-acetaminophen-caffeine (EXCEDRIN MIGRAINE) 250-250-65 MG tablet Take 1 tablet by mouth every 6 (six) hours as needed for headache.    . B-D UF III MINI PEN NEEDLES 31G X 5 MM MISC USE DAILY. 100 each 1  . BAYER MICROLET LANCETS lancets   10  . Blood Glucose Monitoring Suppl (BLOOD GLUCOSE SYSTEM PAK) KIT Please dispense based on patient and insurance preference. Use as directed to monitor FSBS 3x daily. Dx: E11.65. 1 each 1  . cyclobenzaprine (FLEXERIL) 10 MG tablet Take 1 tablet (10 mg total) by mouth 3 (three) times daily as needed for muscle spasms. (Patient not taking: Reported on 09/21/2017) 30 tablet 0  . Glucose Blood (BLOOD GLUCOSE TEST STRIPS) STRP Please dispense based on patient and insurance preference. Use as directed to monitor FSBS 3x daily. Dx: E11.65. 100 each 11  . Guaifenesin 1200 MG TB12 Take 1 tablet (1,200 mg total) by mouth 2 (two) times daily. 20 each 0  . Insulin Detemir (LEVEMIR FLEXTOUCH) 100 UNIT/ML Pen Inject 50 Units daily at 10 pm into the skin. 5 pen 5  . Lancet Devices MISC Please dispense based on patient and insurance preference. Use as directed to monitor FSBS 3x daily. Dx: E11.65. 1 each 1  . lisinopril (PRINIVIL,ZESTRIL) 10 MG tablet TAKE 1 TABLET BY MOUTH EVERY DAY 30 tablet 2  . meloxicam (MOBIC) 7.5 MG tablet TAKE 1  TABLET BY MOUTH EVERY DAY 30 tablet 0  . metoprolol succinate (TOPROL-XL) 25 MG 24 hr tablet TAKE 1 TABLET BY MOUTH EVERY DAY 30 tablet 2  . pioglitazone (ACTOS) 45 MG tablet Take 1 tablet (45 mg total) by mouth daily. 90 tablet 3  . simvastatin (ZOCOR) 10 MG tablet TAKE 1 TABLET BY MOUTH EVERYDAY AT BEDTIME 30 tablet 1   No current facility-administered medications on file prior to visit.     ALLERGIES: Allergies  Allergen Reactions  . Metformin And Related Diarrhea    After one dose,  developed abdominal pain and diarrhea---12/14/2015    FAMILY HISTORY: Family History  Problem Relation Age of Onset  . Hypertension Mother   . Hypertension Father     SOCIAL HISTORY: Social History   Socioeconomic History  . Marital status: Married    Spouse name: Not on file  . Number of children: 1  . Years of education: 89  . Highest education level: Not on file  Social Needs  . Financial resource strain: Not on file  . Food insecurity - worry: Not on file  . Food insecurity - inability: Not on file  . Transportation needs - medical: Not on file  . Transportation needs - non-medical: Not on file  Occupational History  . Not on file  Tobacco Use  . Smoking status: Former Smoker    Packs/day: 0.50    Types: Cigarettes    Last attempt to quit: 05/14/2017    Years since quitting: 0.3  . Smokeless tobacco: Never Used  Substance and Sexual Activity  . Alcohol use: Yes    Alcohol/week: 4.8 oz    Types: 8 Standard drinks or equivalent per week    Comment: mostly on weekend  . Drug use: No  . Sexual activity: Yes    Partners: Female  Other Topics Concern  . Not on file  Social History Narrative   Lives with wife and daughter in a 2 story home.  Has one daughter.  Works on used cars.  Education: college.    REVIEW OF SYSTEMS: Constitutional: No fevers, chills, or sweats, no generalized fatigue, change in appetite Eyes: No visual changes, double vision, eye pain Ear, nose and throat: No hearing loss, ear pain, nasal congestion, sore throat Cardiovascular: No chest pain, palpitations Respiratory:  No shortness of breath at rest or with exertion, wheezes GastrointestinaI: No nausea, vomiting, diarrhea, abdominal pain, fecal incontinence Genitourinary:  No dysuria, urinary retention or frequency Musculoskeletal:  No neck pain, back pain Integumentary: No rash, pruritus, skin lesions Neurological: as above Psychiatric: No depression, insomnia, anxiety Endocrine: No  palpitations, fatigue, diaphoresis, mood swings, change in appetite, change in weight, increased thirst Hematologic/Lymphatic:  No purpura, petechiae. Allergic/Immunologic: no itchy/runny eyes, nasal congestion, recent allergic reactions, rashes  PHYSICAL EXAM: Vitals:   09/21/17 0925  BP: (!) 144/92  Pulse: 97  SpO2: 98%   General: No acute distress.  Patient appears well-groomed.   Head:  Normocephalic/atraumatic Eyes:  Fundi examined but not visualized Neck: supple, no paraspinal tenderness, full range of motion Heart:  Regular rate and rhythm Lungs:  Clear to auscultation bilaterally Back: No paraspinal tenderness Neurological Exam: alert and oriented to person, place, and time. Attention span and concentration intact, recent and remote memory intact, fund of knowledge intact.  Speech fluent and not dysarthric, language intact.  CN II-XII intact. Bulk and tone normal, muscle strength 5/5 throughout.  Sensation to light touch  intact.  Deep tendon reflexes 2+ throughout.  Finger  to nose testing intact.  Gait normal  IMPRESSION & PLAN: 1.  Ophthalmologic exam revealed pseudopapilledema rather than papilledema.  While he may have had idiopathic intracranial hypertension in the past, there is no clinical sign of increased intracranial pressure at this time and he is asymptomatic.  We will see what HVF testing shows.  If everything looks okay, we will discontinue acetazolamide.  He would still need to have routine exams by Dr. Kathlen Mody and if there is any new evidence of papilledema or visual field loss, then he would need to return and restart acetazolamide. 2.  OSA.  He will be starting CPAP 3.  HTN.  Blood pressure mildly elevated.  Follow up with PCP.  15 minutes spent face to face with patient, over 50% spent discussing diagnosis and management.  Metta Clines, DO  CC: Dena Billet, PA-C

## 2017-09-26 ENCOUNTER — Other Ambulatory Visit: Payer: Self-pay | Admitting: Physician Assistant

## 2017-09-26 NOTE — Telephone Encounter (Signed)
Patient is due for an office visit. Letter mailed for patient to call and schedule an appointment. 

## 2017-11-02 ENCOUNTER — Encounter (HOSPITAL_COMMUNITY): Payer: Self-pay | Admitting: *Deleted

## 2017-11-02 ENCOUNTER — Emergency Department (HOSPITAL_COMMUNITY)
Admission: EM | Admit: 2017-11-02 | Discharge: 2017-11-02 | Disposition: A | Discharge status: Home / Self Care | Payer: BLUE CROSS/BLUE SHIELD | Attending: Emergency Medicine | Admitting: Emergency Medicine

## 2017-11-02 ENCOUNTER — Emergency Department (HOSPITAL_COMMUNITY): Payer: BLUE CROSS/BLUE SHIELD

## 2017-11-02 ENCOUNTER — Other Ambulatory Visit: Payer: Self-pay

## 2017-11-02 DIAGNOSIS — Z794 Long term (current) use of insulin: Secondary | ICD-10-CM | POA: Insufficient documentation

## 2017-11-02 DIAGNOSIS — Z79899 Other long term (current) drug therapy: Secondary | ICD-10-CM | POA: Diagnosis not present

## 2017-11-02 DIAGNOSIS — I10 Essential (primary) hypertension: Secondary | ICD-10-CM | POA: Insufficient documentation

## 2017-11-02 DIAGNOSIS — E119 Type 2 diabetes mellitus without complications: Secondary | ICD-10-CM | POA: Diagnosis not present

## 2017-11-02 DIAGNOSIS — R042 Hemoptysis: Secondary | ICD-10-CM | POA: Diagnosis not present

## 2017-11-02 DIAGNOSIS — F17210 Nicotine dependence, cigarettes, uncomplicated: Secondary | ICD-10-CM | POA: Diagnosis not present

## 2017-11-02 LAB — CBC WITH DIFFERENTIAL/PLATELET
BASOS PCT: 0 %
Basophils Absolute: 0 10*3/uL (ref 0.0–0.1)
EOS ABS: 0.1 10*3/uL (ref 0.0–0.7)
Eosinophils Relative: 2 %
HEMATOCRIT: 40.5 % (ref 39.0–52.0)
HEMOGLOBIN: 13.4 g/dL (ref 13.0–17.0)
LYMPHS ABS: 2 10*3/uL (ref 0.7–4.0)
Lymphocytes Relative: 41 %
MCH: 27.1 pg (ref 26.0–34.0)
MCHC: 33.1 g/dL (ref 30.0–36.0)
MCV: 82 fL (ref 78.0–100.0)
Monocytes Absolute: 0.4 10*3/uL (ref 0.1–1.0)
Monocytes Relative: 8 %
NEUTROS ABS: 2.5 10*3/uL (ref 1.7–7.7)
NEUTROS PCT: 49 %
Platelets: 196 10*3/uL (ref 150–400)
RBC: 4.94 MIL/uL (ref 4.22–5.81)
RDW: 16.2 % — ABNORMAL HIGH (ref 11.5–15.5)
WBC: 5 10*3/uL (ref 4.0–10.5)

## 2017-11-02 LAB — COMPREHENSIVE METABOLIC PANEL
ALBUMIN: 3.4 g/dL — AB (ref 3.5–5.0)
ALK PHOS: 73 U/L (ref 38–126)
ALT: 27 U/L (ref 17–63)
AST: 26 U/L (ref 15–41)
Anion gap: 10 (ref 5–15)
BUN: 8 mg/dL (ref 6–20)
CALCIUM: 8.6 mg/dL — AB (ref 8.9–10.3)
CO2: 23 mmol/L (ref 22–32)
CREATININE: 1.15 mg/dL (ref 0.61–1.24)
Chloride: 104 mmol/L (ref 101–111)
GFR calc Af Amer: >60 mL/min (ref >60)
GFR calc non Af Amer: >60 mL/min (ref >60)
GLUCOSE: 139 mg/dL — AB (ref 65–99)
Potassium: 3.1 mmol/L — ABNORMAL LOW (ref 3.5–5.1)
SODIUM: 137 mmol/L (ref 135–145)
Total Bilirubin: 0.6 mg/dL (ref 0.3–1.2)
Total Protein: 6.6 g/dL (ref 6.5–8.1)

## 2017-11-02 MED ORDER — DOXYCYCLINE HYCLATE 100 MG PO CAPS: 100.0000 mg | ORAL_CAPSULE | Freq: Two times a day (BID) | ORAL | 0 refills | Status: DC

## 2017-11-02 MED ORDER — POTASSIUM CHLORIDE CRYS ER 20 MEQ PO TBCR
40.0000 meq | EXTENDED_RELEASE_TABLET | Freq: Once | ORAL | Status: AC
  Administered 2017-11-02: 40 meq via ORAL
  Filled 2017-11-02: qty 2

## 2017-11-02 MED ORDER — DOXYCYCLINE HYCLATE 100 MG PO TABS
100.0000 mg | ORAL_TABLET | Freq: Once | ORAL | Status: AC
  Administered 2017-11-02: 100 mg via ORAL
  Filled 2017-11-02: qty 1

## 2017-11-02 NOTE — ED Provider Notes (Signed)
[facility] EMERGENCY DEPARTMENT Provider Note   CSN: [id number] Arrival date & time: 11/02/17  1131     History   Chief Complaint Chief Complaint  Patient presents with  . Hemoptysis    HPI Kevin Yates is a 42 y.o. male.  Patient has had a cough fever and aches.  He was seen recently and diagnosed with influenza and put on Tamiflu.  Patient coughed up small bits of blood today came in for reevaluation  The history is provided by the patient.  Cough  This is a recurrent problem. The current episode started more than 2 days ago. The problem occurs constantly. The problem has not changed since onset.The cough is productive of bloody sputum. There has been no fever. Pertinent negatives include no chest pain and no headaches. Treatments tried: Tamiflu. The treatment provided no relief. He is not a smoker. His past medical history does not include pneumonia.    Past Medical History:  Diagnosis Date  . Diabetes mellitus without complication ([place])   . Hypertension     Patient Active Problem List   Diagnosis Date Noted  . OSA (obstructive sleep apnea) 06/21/2017  . Hyperlipidemia 02/22/2017  . 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 Bleeding external hemorrhoids 12/16/2015  . Diabetes mellitus type 2, uncomplicated ([place]) 24/58/0998  . Essential hypertension 12/15/2015    History reviewed. No pertinent surgical history.      Home Medications    Prior to Admission medications   Medication Sig Start Date End Date Taking? Authorizing Provider  acetaZOLAMIDE (DIAMOX) 500 MG capsule Take 1 capsule (500 mg total) by mouth 2 (two) times daily. 09/21/17   Pieter Partridge, DO  aspirin-acetaminophen-caffeine (EXCEDRIN MIGRAINE) [phone] MG tablet Take 1 tablet by mouth every 6 (six) hours as needed for headache.    [provider]  B-D UF III MINI PEN NEEDLES 31G X 5 MM MISC USE DAILY. 04/09/17   Orlena Sheldon, PA-C  BAYER MICROLET LANCETS lancets  12/24/15    [provider]  Blood Glucose Monitoring Suppl (BLOOD GLUCOSE SYSTEM PAK) KIT Please dispense based on patient and insurance preference. Use as directed to monitor FSBS 3x daily. Dx: E11.65. 12/24/15   Orlena Sheldon, PA-C  cyclobenzaprine (FLEXERIL) 10 MG tablet Take 1 tablet (10 mg total) by mouth 3 (three) times daily as needed for muscle spasms. Patient not taking: Reported on 09/21/2017 05/24/17   Dena Billet B, PA-C  doxycycline (VIBRAMYCIN) 100 MG capsule Take 1 capsule (100 mg total) by mouth 2 (two) times daily. One po bid x 7 days 11/02/17   Milton Ferguson, MD  Glucose Blood (BLOOD GLUCOSE TEST STRIPS) STRP Please dispense based on patient and insurance preference. Use as directed to monitor FSBS 3x daily. Dx: E11.65. 12/24/15   Orlena Sheldon, PA-C  Guaifenesin 1200 MG TB12 Take 1 tablet (1,200 mg total) by mouth 2 (two) times daily. 07/17/17   Lawyer, Harrell Gave, PA-C  Insulin Detemir (LEVEMIR FLEXTOUCH) 100 UNIT/ML Pen Inject 50 Units daily at 10 pm into the skin. 06/19/17   Orlena Sheldon, PA-C  Lancet Devices MISC Please dispense based on patient and insurance preference. Use as directed to monitor FSBS 3x daily. Dx: E11.65. 12/24/15   Dena Billet B, PA-C  lisinopril (PRINIVIL,ZESTRIL) 10 MG tablet TAKE 1 TABLET BY MOUTH EVERY DAY 09/26/17   Dena Billet B, PA-C  meloxicam (MOBIC) 7.5 MG tablet TAKE 1 TABLET BY MOUTH EVERY DAY 06/28/17   Orlena Sheldon, PA-C  metoprolol succinate (TOPROL-XL) 25 MG 24 hr tablet TAKE 1 TABLET BY MOUTH EVERY DAY 05/07/17   Dena Billet B, PA-C  pioglitazone (ACTOS) 45 MG tablet Take 1 tablet (45 mg total) by mouth daily. 05/24/17   Dena Billet B, PA-C  simvastatin (ZOCOR) 10 MG tablet TAKE 1 TABLET BY MOUTH EVERYDAY AT BEDTIME 05/07/17   Orlena Sheldon, PA-C    Family History Family History  Problem Relation Age of Onset  . Hypertension Mother   . Hypertension Father     Social History Social History   Tobacco Use  . Smoking status: Former Smoker     Packs/day: 0.50    Types: Cigarettes    Last attempt to quit: 05/14/2017    Years since quitting: 0.4  . Smokeless tobacco: Never Used  Substance Use Topics  . Alcohol use: Yes    Alcohol/week: 4.8 oz    Types: 8 Standard drinks or equivalent per week    Comment: mostly on weekend  . Drug use: No     Allergies   Metformin and related   Review of Systems Review of Systems  Constitutional: Negative for appetite change and fatigue.  HENT: Negative for congestion, ear discharge and sinus pressure.   Eyes: Negative for discharge.  Respiratory: Positive for cough.   Cardiovascular: Negative for chest pain.  Gastrointestinal: Negative for abdominal pain and diarrhea.  Genitourinary: Negative for frequency and hematuria.  Musculoskeletal: Negative for back pain.  Skin: Negative for rash.  Neurological: Negative for seizures and headaches.  Psychiatric/Behavioral: Negative for hallucinations.     Physical Exam Updated Vital Signs BP 138/88 (BP Location: Right Arm)   Pulse 93   Temp 98.6 F (37 C) (Oral)   Resp 14   Ht _0  (1.753 m)   Wt 102.1 kg (225 lb)   SpO2 98%   BMI 33.23 kg/m   Physical Exam  Constitutional: He is oriented to person, place, and time. He appears well-developed.  HENT:  Head: Normocephalic.  Eyes: Conjunctivae and EOM are normal. No scleral icterus.  Neck: Neck supple. No thyromegaly present.  Cardiovascular: Normal rate and regular rhythm. Exam reveals no gallop and no friction rub.  No murmur heard. Pulmonary/Chest: No stridor. He has no wheezes. He has no rales. He exhibits no tenderness.  Abdominal: He exhibits no distension. There is no tenderness. There is no rebound.  Musculoskeletal: Normal range of motion. He exhibits no edema.  Lymphadenopathy:    He has no cervical adenopathy.  Neurological: He is oriented to person, place, and time. He exhibits normal muscle tone. Coordination normal.  Skin: No rash noted. No erythema.    Psychiatric: He has a normal mood and affect. His behavior is normal.     ED Treatments / Results  Labs (all labs ordered are listed, but only abnormal results are displayed) Labs Reviewed  CBC WITH DIFFERENTIAL/PLATELET - Abnormal; Notable for the following components:      Result Value   RDW 16.2 (*)    All other components within normal limits  COMPREHENSIVE METABOLIC PANEL - Abnormal; Notable for the following components:   Potassium 3.1 (*)    Glucose, Bld 139 (*)    Calcium 8.6 (*)    Albumin 3.4 (*)    All other components within normal limits    EKG None  Radiology Dg Chest 2 View  Result Date: 11/02/2017 CLINICAL DATA:  Hemoptysis EXAM: CHEST - 2 VIEW COMPARISON:  07/17/2017 FINDINGS: Cardiac shadow is within normal limits.  The lungs are well aerated bilaterally. Bilateral infiltrative changes are noted within the right upper and superior segment of the left lower lobe. No sizable effusion is seen. No bony abnormality is noted. IMPRESSION: Patchy multifocal pneumonia. Electronically Signed   By: Inez Catalina M.D.   On: 11/02/2017 12:23    Procedures Procedures (including critical care time)  Medications Ordered in ED Medications  potassium chloride SA (K-DUR,KLOR-CON) CR tablet 40 mEq (has no administration in time range)  doxycycline (VIBRA-TABS) tablet 100 mg (has no administration in time range)     Initial Impression / Assessment and Plan / ED Course  I have reviewed the triage vital signs and the nursing notes.  Pertinent labs & imaging results that were available during my care of the patient were reviewed by me and considered in my medical decision making (see chart for details).     Chest x-ray shows pneumonia CBC and chemistries unremarkable except for mild hypokalemia.  Patient will be placed on doxycycline and will follow up with his PCP next week.  If he starts coughing up a lot of blood or feeling worse he is to come back to the emergency  department Final Clinical Impressions(s) / ED Diagnoses   Final diagnoses:  Hemoptysis    ED Discharge Orders        Ordered    doxycycline (VIBRAMYCIN) 100 MG capsule  2 times daily     11/02/17 1834       Milton Ferguson, MD 11/02/17 [phone]

## 2017-11-02 NOTE — ED Notes (Signed)
Patient reports that he was diagnosed with Flu on Tuesday. He has been taking his Tamiflu as prescribed. He states that he started coughing up blood "midnight-last night".

## 2017-11-02 NOTE — ED Triage Notes (Signed)
[facility] but now reports coughing up blood. No resp distress is noted at triage and mask on pt.

## 2017-11-02 NOTE — Discharge Instructions (Addendum)
Follow-up with your family doctor next week return to the emergency department sooner if any problems

## 2017-11-05 ENCOUNTER — Other Ambulatory Visit: Payer: Self-pay | Admitting: Physician Assistant

## 2017-11-15 ENCOUNTER — Other Ambulatory Visit: Payer: Self-pay | Admitting: Physician Assistant

## 2017-11-15 NOTE — Telephone Encounter (Signed)
Rx filled per protocol patient is due for office visit with lab work. Letter mailed for patient to call and schedule an appointment

## 2017-12-06 NOTE — Telephone Encounter (Signed)
Error

## 2017-12-18 ENCOUNTER — Emergency Department (HOSPITAL_COMMUNITY): Payer: BLUE CROSS/BLUE SHIELD

## 2017-12-18 ENCOUNTER — Inpatient Hospital Stay (HOSPITAL_COMMUNITY)
Admission: EM | Admit: 2017-12-18 | Discharge: 2017-12-20 | DRG: 286 | Disposition: A | Discharge status: Home / Self Care | Payer: BLUE CROSS/BLUE SHIELD | Attending: Family Medicine | Admitting: Family Medicine

## 2017-12-18 ENCOUNTER — Other Ambulatory Visit: Payer: Self-pay

## 2017-12-18 ENCOUNTER — Encounter (HOSPITAL_COMMUNITY): Payer: Self-pay | Admitting: *Deleted

## 2017-12-18 DIAGNOSIS — Z794 Long term (current) use of insulin: Secondary | ICD-10-CM | POA: Diagnosis not present

## 2017-12-18 DIAGNOSIS — Z888 Allergy status to other drugs, medicaments and biological substances status: Secondary | ICD-10-CM

## 2017-12-18 DIAGNOSIS — I509 Heart failure, unspecified: Secondary | ICD-10-CM

## 2017-12-18 DIAGNOSIS — L500 Allergic urticaria: Secondary | ICD-10-CM | POA: Diagnosis not present

## 2017-12-18 DIAGNOSIS — I428 Other cardiomyopathies: Secondary | ICD-10-CM | POA: Diagnosis present

## 2017-12-18 DIAGNOSIS — I5021 Acute systolic (congestive) heart failure: Secondary | ICD-10-CM | POA: Diagnosis present

## 2017-12-18 DIAGNOSIS — R0602 Shortness of breath: Secondary | ICD-10-CM

## 2017-12-18 DIAGNOSIS — I447 Left bundle-branch block, unspecified: Secondary | ICD-10-CM | POA: Diagnosis present

## 2017-12-18 DIAGNOSIS — Y92238 Other place in hospital as the place of occurrence of the external cause: Secondary | ICD-10-CM | POA: Diagnosis not present

## 2017-12-18 DIAGNOSIS — E119 Type 2 diabetes mellitus without complications: Secondary | ICD-10-CM | POA: Diagnosis not present

## 2017-12-18 DIAGNOSIS — E785 Hyperlipidemia, unspecified: Secondary | ICD-10-CM | POA: Diagnosis present

## 2017-12-18 DIAGNOSIS — I110 Hypertensive heart disease with heart failure: Principal | ICD-10-CM | POA: Diagnosis present

## 2017-12-18 DIAGNOSIS — T508X5A Adverse effect of diagnostic agents, initial encounter: Secondary | ICD-10-CM | POA: Diagnosis not present

## 2017-12-18 DIAGNOSIS — Z8249 Family history of ischemic heart disease and other diseases of the circulatory system: Secondary | ICD-10-CM

## 2017-12-18 DIAGNOSIS — G4733 Obstructive sleep apnea (adult) (pediatric): Secondary | ICD-10-CM | POA: Diagnosis not present

## 2017-12-18 DIAGNOSIS — I10 Essential (primary) hypertension: Secondary | ICD-10-CM | POA: Diagnosis present

## 2017-12-18 DIAGNOSIS — R0603 Acute respiratory distress: Secondary | ICD-10-CM | POA: Diagnosis present

## 2017-12-18 DIAGNOSIS — Z9119 Patient's noncompliance with other medical treatment and regimen: Secondary | ICD-10-CM

## 2017-12-18 DIAGNOSIS — F17210 Nicotine dependence, cigarettes, uncomplicated: Secondary | ICD-10-CM | POA: Diagnosis present

## 2017-12-18 DIAGNOSIS — I2510 Atherosclerotic heart disease of native coronary artery without angina pectoris: Secondary | ICD-10-CM | POA: Diagnosis present

## 2017-12-18 LAB — CBC WITH DIFFERENTIAL/PLATELET
BASOS ABS: 0 10*3/uL (ref 0.0–0.1)
BASOS PCT: 0 %
Eosinophils Absolute: 0.1 10*3/uL (ref 0.0–0.7)
Eosinophils Relative: 1 %
HEMATOCRIT: 41 % (ref 39.0–52.0)
Hemoglobin: 13.6 g/dL (ref 13.0–17.0)
Lymphocytes Relative: 43 %
Lymphs Abs: 3.1 10*3/uL (ref 0.7–4.0)
MCH: 27.5 pg (ref 26.0–34.0)
MCHC: 33.2 g/dL (ref 30.0–36.0)
MCV: 82.8 fL (ref 78.0–100.0)
MONO ABS: 0.4 10*3/uL (ref 0.1–1.0)
Monocytes Relative: 5 %
NEUTROS ABS: 3.6 10*3/uL (ref 1.7–7.7)
NEUTROS PCT: 51 %
Platelets: 258 10*3/uL (ref 150–400)
RBC: 4.95 MIL/uL (ref 4.22–5.81)
RDW: 16.6 % — AB (ref 11.5–15.5)
WBC: 7.2 10*3/uL (ref 4.0–10.5)

## 2017-12-18 LAB — I-STAT TROPONIN, ED
TROPONIN I, POC: 0 ng/mL (ref 0.00–0.08)
Troponin i, poc: 0 ng/mL (ref 0.00–0.08)

## 2017-12-18 LAB — BASIC METABOLIC PANEL
Anion gap: 9 (ref 5–15)
BUN: 8 mg/dL (ref 6–20)
CHLORIDE: 106 mmol/L (ref 101–111)
CO2: 25 mmol/L (ref 22–32)
Calcium: 8.8 mg/dL — ABNORMAL LOW (ref 8.9–10.3)
Creatinine, Ser: 0.96 mg/dL (ref 0.61–1.24)
GFR calc Af Amer: >60 mL/min (ref >60)
GLUCOSE: 87 mg/dL (ref 65–99)
POTASSIUM: 3.6 mmol/L (ref 3.5–5.1)
Sodium: 140 mmol/L (ref 135–145)

## 2017-12-18 LAB — BRAIN NATRIURETIC PEPTIDE: B Natriuretic Peptide: 265.9 pg/mL — ABNORMAL HIGH (ref 0.0–100.0)

## 2017-12-18 LAB — HEMOGLOBIN A1C
HEMOGLOBIN A1C: 6 % — AB (ref 4.8–5.6)
MEAN PLASMA GLUCOSE: 125.5 mg/dL

## 2017-12-18 LAB — CBG MONITORING, ED: Glucose-Capillary: 107 mg/dL — ABNORMAL HIGH (ref 65–99)

## 2017-12-18 LAB — D-DIMER, QUANTITATIVE: D-Dimer, Quant: 0.28 ug/mL-FEU (ref 0.00–0.50)

## 2017-12-18 MED ORDER — METOPROLOL SUCCINATE ER 25 MG PO TB24
25.0000 mg | ORAL_TABLET | Freq: Every day | ORAL | Status: DC
  Administered 2017-12-18 – 2017-12-20 (×3): 25 mg via ORAL
  Filled 2017-12-18 (×3): qty 1

## 2017-12-18 MED ORDER — INSULIN ASPART 100 UNIT/ML ~~LOC~~ SOLN
0.0000 [IU] | Freq: Every day | SUBCUTANEOUS | Status: DC
Start: 2017-12-18 — End: 2017-12-20

## 2017-12-18 MED ORDER — ACETAMINOPHEN 650 MG RE SUPP: 650.0000 mg | Freq: Four times a day (QID) | RECTAL | Status: DC | PRN

## 2017-12-18 MED ORDER — INSULIN DETEMIR 100 UNIT/ML ~~LOC~~ SOLN
40.0000 [IU] | Freq: Every day | SUBCUTANEOUS | Status: DC
  Administered 2017-12-18 – 2017-12-19 (×2): 40 [IU] via SUBCUTANEOUS
  Filled 2017-12-18 (×3): qty 0.4

## 2017-12-18 MED ORDER — INSULIN DETEMIR 100 UNIT/ML FLEXPEN: 40.0000 [IU] | PEN_INJECTOR | Freq: Every day | SUBCUTANEOUS | Status: DC

## 2017-12-18 MED ORDER — ACETAMINOPHEN 325 MG PO TABS: 650.0000 mg | ORAL_TABLET | Freq: Four times a day (QID) | ORAL | Status: DC | PRN

## 2017-12-18 MED ORDER — INSULIN ASPART 100 UNIT/ML ~~LOC~~ SOLN: 0.0000 [IU] | Freq: Three times a day (TID) | SUBCUTANEOUS | Status: DC

## 2017-12-18 MED ORDER — SODIUM CHLORIDE 0.9% FLUSH
3.0000 mL | Freq: Two times a day (BID) | INTRAVENOUS | Status: DC
  Administered 2017-12-18 – 2017-12-19 (×3): 3 mL via INTRAVENOUS

## 2017-12-18 MED ORDER — ONDANSETRON HCL 4 MG PO TABS: 4.0000 mg | ORAL_TABLET | Freq: Four times a day (QID) | ORAL | Status: DC | PRN

## 2017-12-18 MED ORDER — LISINOPRIL 10 MG PO TABS
10.0000 mg | ORAL_TABLET | Freq: Every day | ORAL | Status: DC
  Administered 2017-12-18 – 2017-12-19 (×2): 10 mg via ORAL
  Filled 2017-12-18 (×2): qty 1

## 2017-12-18 MED ORDER — HYDRALAZINE HCL 20 MG/ML IJ SOLN: 10.0000 mg | INTRAMUSCULAR | Status: DC | PRN

## 2017-12-18 MED ORDER — ONDANSETRON HCL 4 MG/2ML IJ SOLN: 4.0000 mg | Freq: Four times a day (QID) | INTRAMUSCULAR | Status: DC | PRN

## 2017-12-18 MED ORDER — SIMVASTATIN 10 MG PO TABS
10.0000 mg | ORAL_TABLET | Freq: Every day | ORAL | Status: DC
  Administered 2017-12-18 – 2017-12-19 (×2): 10 mg via ORAL
  Filled 2017-12-18 (×2): qty 1

## 2017-12-18 MED ORDER — ENOXAPARIN SODIUM 40 MG/0.4ML ~~LOC~~ SOLN
40.0000 mg | SUBCUTANEOUS | Status: DC
  Administered 2017-12-18: 40 mg via SUBCUTANEOUS
  Filled 2017-12-18: qty 0.4

## 2017-12-18 MED ORDER — FUROSEMIDE 10 MG/ML IJ SOLN
40.0000 mg | Freq: Once | INTRAMUSCULAR | Status: AC
  Administered 2017-12-18: 40 mg via INTRAVENOUS
  Filled 2017-12-18: qty 4

## 2017-12-18 NOTE — ED Triage Notes (Signed)
C/o sob states he wakes up with sob x 3-4 days , states he is fine during the day however its worse when he lays down. States he had the same several months ago and was told he had pneumonia. Only has chest pain when coughing

## 2017-12-18 NOTE — ED Notes (Signed)
Heart healthy/carb modified tray ordered 

## 2017-12-18 NOTE — H&P (Signed)
History and Physical   Kevin Yates NWG:[id number] DOB: 1976-04-07 DOA: 12/18/2017  Referring MD/NP/PA: Claudia Desanctis, PA, EDP PCP: Dorena Bodo PA-C Outpatient Specialists: Craige Cotta, pulmonology  Patient coming from: Home  Chief Complaint: Dyspnea, orthopnea  HPI: Kevin Yates is a 42 y.o. male with a history of IDT2DM, OSA not on CPAP, HTN, HLD who presented to the ED due to progressive dyspnea worst at night.   He reports 4-5 nights with episodes where he wakes up suddenly short of breath which improves when sitting up, associated with a dry cough which is associated with sharp pain across his chest only when coughing. He always sleeps propped up on pillows but notes increasing propping up has helped symptoms. Since the episodes have become more intense he decided to come to the ED for evaluation.   He was evaluated for dyspnea, cough and fever on 3/22, found to have multifocal pneumonia and completed a course of doxycycline with improvement. He had some wheezing at that time, and has this intermittently since. Has +sleep study last year but has not been able to afford a CPAP, reports significant daytime drowsiness.  ED Course: On arrival he was regularly tachycardic and hypertensive. Afebrile with crackles on exam and increased work of breathing when EDP walked with patient. BMP not done but CBC w/diff is largely normal. 2v CXR shows increased vascular congestion and some reticular opacities at bases with borderline cardiomegaly. BNP 265, troponin negative x2. D-dimer wnl. ECG shows sinus tachycardia with IVCD most consistent with LBBB which was present on last ECG in 2018.   Review of Systems: No fever, chills, weight gain or loss, leg swelling, palpitations, abd pain, N/V/D, arthralgias, myalgias, rashes, and per HPI. All others reviewed and are negative.   Past Medical History:  Diagnosis Date  . Diabetes mellitus without complication (HCC)   . Hypertension    History reviewed. No  pertinent surgical history. - He drinks about a fifth of vodka every weekend and smokes heavily only on weekends. Denies any withdrawal symptoms on Mondays. Does cocaine intermittently, last was 1 month ago. Denies any other illicit substances. 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    reports that he has been smoking cigarettes.  He has been smoking about 0.50 packs per day. He has never used smokeless tobacco. He reports that he drinks about 4.8 oz of alcohol per week. He reports that he does not use drugs. Allergies  Allergen Reactions  . Metformin And Related Diarrhea    After one dose, developed abdominal pain and diarrhea---12/14/2015   Family History  Problem Relation Age of Onset  . Hypertension Mother   . Hypertension Father    - Grandfather died of MI at 3. Family history otherwise reviewed and not pertinent.  Prior to Admission medications   Medication Sig Start Date End Date Taking? Authorizing Provider  Insulin Detemir (LEVEMIR FLEXTOUCH) 100 UNIT/ML Pen Inject 50 Units daily at 10 pm into the skin. 06/19/17  Yes Allayne Butcher B, PA-C  lisinopril (PRINIVIL,ZESTRIL) 10 MG tablet TAKE 1 TABLET BY MOUTH EVERY DAY 11/05/17  Yes Allayne Butcher B, PA-C  metoprolol succinate (TOPROL-XL) 25 MG 24 hr tablet TAKE 1 TABLET BY MOUTH EVERY DAY 11/05/17  Yes Allayne Butcher B, PA-C  simvastatin (ZOCOR) 10 MG tablet TAKE 1 TABLET BY MOUTH EVERYDAY AT BEDTIME 11/15/17  Yes Allayne Butcher B, PA-C  acetaZOLAMIDE (DIAMOX) 500 MG capsule Take 1 capsule (500 mg total) by mouth 2 (two)  times daily. Patient not taking: Reported on 12/18/2017 09/21/17   Drema Dallas, DO  cyclobenzaprine (FLEXERIL) 10 MG tablet Take 1 tablet (10 mg total) by mouth 3 (three) times daily as needed for muscle spasms. Patient not taking: Reported on 09/21/2017 05/24/17   Allayne Butcher B, PA-C  doxycycline (VIBRAMYCIN) 100 MG capsule Take 1 capsule (100 mg total) by mouth 2 (two) times daily. One po bid x 7 days Patient not  taking: Reported on 12/18/2017 11/02/17   Bethann Berkshire, MD  Guaifenesin 1200 MG TB12 Take 1 tablet (1,200 mg total) by mouth 2 (two) times daily. Patient not taking: Reported on 12/18/2017 07/17/17   Charlestine Night, PA-C  meloxicam (MOBIC) 7.5 MG tablet TAKE 1 TABLET BY MOUTH EVERY DAY Patient not taking: Reported on 12/18/2017 06/28/17   Allayne Butcher B, PA-C  pioglitazone (ACTOS) 45 MG tablet Take 1 tablet (45 mg total) by mouth daily. Patient not taking: Reported on 12/18/2017 05/24/17   Dorena Bodo, PA-C    Physical Exam: Vitals:   12/18/17 1130 12/18/17 1200 12/18/17 1300 12/18/17 1500  BP: (!) 150/103 (!) 150/104 (!) 154/110 (!) 155/90  Pulse: (!) 102 92 93 (!) 110  Resp: 17 11 10  (!) 23  Temp:      TempSrc:      SpO2: 99% 99% 98% 98%  Weight:      Height:       Constitutional: 42 y.o. male in no distress, calm demeanor Eyes: Lids and conjunctivae normal, PERRL ENMT: Mucous membranes are moist. Posterior pharynx clear of any exudate or lesions. Fair dentition.  Neck: normal, supple, no masses, no thyromegaly Respiratory: Becomes tachypneic and labored with ambulation. Bibasilar crackles very faintly. Cardiovascular: Regular rate and rhythm, no murmur +S3, no JVD or LE edema Abdomen: Normoactive bowel sounds. No tenderness, non-distended, and no masses palpated. No hepatosplenomegaly. GU: No indwelling catheter Musculoskeletal: No clubbing / cyanosis. No joint deformity upper and lower extremities. Good ROM, no contractures. Normal muscle tone.  Skin: Warm, dry. No rashes, wounds, or ulcers. No significant lesions noted.  Neurologic: CN II-XII grossly intact. Gait narrow based. Speech normal. No focal deficits in motor strength or sensation in all extremities.  Psychiatric: Alert and oriented x3. Normal judgment and insight. Mood euthymic with congruent affect.   Labs on Admission: I have personally reviewed following labs and imaging studies  CBC: Recent Labs  Lab  12/18/17 0332  WBC 7.2  NEUTROABS 3.6  HGB 13.6  HCT 41.0  MCV 82.8  PLT 258   Basic Metabolic Panel: No results for input(s): NA, K, CL, CO2, GLUCOSE, BUN, CREATININE, CALCIUM, MG, PHOS in the last 168 hours. GFR: CrCl cannot be calculated (Patient's most recent lab result is older than the maximum 21 days allowed.). Liver Function Tests: No results for input(s): AST, ALT, ALKPHOS, BILITOT, PROT, ALBUMIN in the last 168 hours. No results for input(s): LIPASE, AMYLASE in the last 168 hours. No results for input(s): AMMONIA in the last 168 hours. Coagulation Profile: No results for input(s): INR, PROTIME in the last 168 hours. Cardiac Enzymes: No results for input(s): CKTOTAL, CKMB, CKMBINDEX, TROPONINI in the last 168 hours. BNP (last 3 results) No results for input(s): PROBNP in the last 8760 hours. HbA1C: No results for input(s): HGBA1C in the last 72 hours. CBG: No results for input(s): GLUCAP in the last 168 hours. Lipid Profile: No results for input(s): CHOL, HDL, LDLCALC, TRIG, CHOLHDL, LDLDIRECT in the last 72 hours. Thyroid Function Tests: No results  for input(s): TSH, T4TOTAL, FREET4, T3FREE, THYROIDAB in the last 72 hours. Anemia Panel: No results for input(s): VITAMINB12, FOLATE, FERRITIN, TIBC, IRON, RETICCTPCT in the last 72 hours. Urine analysis:    Component Value Date/Time   COLORURINE STRAW 12/13/2015 1537   APPEARANCEUR CLEAR 12/13/2015 1537   LABSPEC 1.005 12/13/2015 1537   PHURINE 5.0 12/13/2015 1537   GLUCOSEU 2+ (A) 12/13/2015 1537   HGBUR TRACE (A) 12/13/2015 1537   BILIRUBINUR NEGATIVE 12/13/2015 1537   KETONESUR 2+ (A) 12/13/2015 1537   PROTEINUR NEGATIVE 12/13/2015 1537   NITRITE NEGATIVE 12/13/2015 1537   LEUKOCYTESUR NEGATIVE 12/13/2015 1537    No results found for this or any previous visit (from the past 240 hour(s)).   Radiological Exams on Admission: Dg Chest 2 View  Result Date: 12/18/2017 CLINICAL DATA:  42 y/o  M; shortness of  breath. EXAM: CHEST - 2 VIEW COMPARISON:  11/02/2017 chest radiograph. FINDINGS: Stable mild cardiomegaly. Interval resolution of bilateral upper lobe pneumonia. Increased reticular opacities greatest in the lung bases. No focal consolidation, effusion, or pneumothorax. Bones are unremarkable. IMPRESSION: Mild cardiomegaly. Increase reticular opacities greatest in the lung bases, probably bronchitic changes, possibly vascular congestion. Electronically Signed   By: Mitzi Hansen M.D.   On: 12/18/2017 04:54   EKG: Independently reviewed. Sinus tachycardia with LBBB.  Assessment/Plan: Acute respiratory distress: Suspect due to new onset CHF based on symptoms, elevated BNP and vascularity on CXR. Negative d-dimer and exam not consistent with bronchitis. Untreated sleep apnea is likely contributing. - Echocardiogram ordered.  - STILL awaiting BMP, not done in ED, will dose lasix x1 now and recheck in AM.  - Daily weights, I/O  IDT2DM:  - Continue insulin at slightly lower dose and add SSI  Essential hypertension: Elevated in ED. - Home medications - IV hydralazine IV prn.  Hyperlipidemia - Continue statin  OSA:  - CPAP qHS - CM consulted for assistance with obtaining CPAP.   DVT prophylaxis: Lovenox  Code Status: Full  Family Communication: None at bedside Disposition Plan: Home when improved Consults called: None  Admission status: Observation    Tyrone Nine, MD [facility] Pager [phone]  If 7PM-7AM, please contact night-coverage [email or web address] Password [email or web address] 12/18/2017, 5:13 PM

## 2017-12-18 NOTE — ED Provider Notes (Signed)
[facility] EMERGENCY DEPARTMENT Provider Note   CSN: [id number] Arrival date & time: 12/18/17  0306     History   Chief Complaint Chief Complaint  Patient presents with  . Shortness of Breath    HPI Kevin Yates is a 42 y.o. male.  HPI   Kevin Yates is a 42 year old male with a history of type 2 diabetes, hypertension, obstructive sleep apnea, hyperlipidemia who presented to the emergency department for evaluation of shortness of breath.  Patient reports that for the past 2 weeks now he has awoken from sleep feeling short of breath and as if he has what sounds in his lungs.  He denies shortness of breath during the day, only has it in the middle the night.  States that shortness of breath is worse with laying flat, and he has to lean on several pillows.  He also has had a dry cough for a few weeks now.  Denies associated fever, chills, wheezing, leg swelling, nausea/vomiting, lightheadedness, diaphoresis, numbness, weakness, sore throat, congestion.  He endorses anterior chest wall pain with cough only, denies chest pain outside of cough.  He smokes about 3 cigarettes daily since age 48.  Reports that he had a grandfather who died of heart attack at age 54 years, denies exertional chest pain or history of MI personally.  He denies history of DVT/PE, leg swelling/calf tenderness, recent surgery or immobilization, exogenous testosterone.  Past Medical History:  Diagnosis Date  . Diabetes mellitus without complication ([place])   . Hypertension     Patient Active Problem List   Diagnosis Date Noted  . OSA (obstructive sleep apnea) 06/21/2017  . Hyperlipidemia 02/22/2017  . 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 Bleeding external hemorrhoids 12/16/2015  . Diabetes mellitus type 2, uncomplicated ([place]) 18/84/1660  . Essential hypertension 12/15/2015    History reviewed. No pertinent surgical history.      Home Medications    Prior to Admission medications     Medication Sig Start Date End Date Taking? Authorizing Provider  acetaZOLAMIDE (DIAMOX) 500 MG capsule Take 1 capsule (500 mg total) by mouth 2 (two) times daily. 09/21/17   Pieter Partridge, DO  aspirin-acetaminophen-caffeine (EXCEDRIN MIGRAINE) [phone] MG tablet Take 1 tablet by mouth every 6 (six) hours as needed for headache.    [provider]  B-D UF III MINI PEN NEEDLES 31G X 5 MM MISC USE DAILY. 04/09/17   Orlena Sheldon, PA-C  BAYER MICROLET LANCETS lancets  12/24/15   [provider]  Blood Glucose Monitoring Suppl (BLOOD GLUCOSE SYSTEM PAK) KIT Please dispense based on patient and insurance preference. Use as directed to monitor FSBS 3x daily. Dx: E11.65. 12/24/15   Orlena Sheldon, PA-C  cyclobenzaprine (FLEXERIL) 10 MG tablet Take 1 tablet (10 mg total) by mouth 3 (three) times daily as needed for muscle spasms. Patient not taking: Reported on 09/21/2017 05/24/17   Dena Billet B, PA-C  doxycycline (VIBRAMYCIN) 100 MG capsule Take 1 capsule (100 mg total) by mouth 2 (two) times daily. One po bid x 7 days 11/02/17   Milton Ferguson, MD  Glucose Blood (BLOOD GLUCOSE TEST STRIPS) STRP Please dispense based on patient and insurance preference. Use as directed to monitor FSBS 3x daily. Dx: E11.65. 12/24/15   Orlena Sheldon, PA-C  Guaifenesin 1200 MG TB12 Take 1 tablet (1,200 mg total) by mouth 2 (two) times daily. 07/17/17   Lawyer, Harrell Gave, PA-C  Insulin Detemir (LEVEMIR FLEXTOUCH) 100 UNIT/ML Pen Inject 50 Units  daily at 10 pm into the skin. 06/19/17   Orlena Sheldon, PA-C  Lancet Devices MISC Please dispense based on patient and insurance preference. Use as directed to monitor FSBS 3x daily. Dx: E11.65. 12/24/15   Dena Billet B, PA-C  lisinopril (PRINIVIL,ZESTRIL) 10 MG tablet TAKE 1 TABLET BY MOUTH EVERY DAY 11/05/17   Dena Billet B, PA-C  meloxicam (MOBIC) 7.5 MG tablet TAKE 1 TABLET BY MOUTH EVERY DAY 06/28/17   Dena Billet B, PA-C  metoprolol succinate (TOPROL-XL) 25 MG 24 hr tablet  TAKE 1 TABLET BY MOUTH EVERY DAY 11/05/17   Dena Billet B, PA-C  pioglitazone (ACTOS) 45 MG tablet Take 1 tablet (45 mg total) by mouth daily. 05/24/17   Dena Billet B, PA-C  simvastatin (ZOCOR) 10 MG tablet TAKE 1 TABLET BY MOUTH EVERYDAY AT BEDTIME 11/15/17   Orlena Sheldon, PA-C    Family History Family History  Problem Relation Age of Onset  . Hypertension Mother   . Hypertension Father     Social History Social History   Tobacco Use  . Smoking status: Current Some Day Smoker    Packs/day: 0.50    Types: Cigarettes    Last attempt to quit: 05/14/2017    Years since quitting: 0.5  . Smokeless tobacco: Never Used  Substance Use Topics  . Alcohol use: Yes    Alcohol/week: 4.8 oz    Types: 8 Standard drinks or equivalent per week    Comment: mostly on weekend  . Drug use: No     Allergies   Metformin and related   Review of Systems Review of Systems  Constitutional: Negative for chills, diaphoresis and fever.  HENT: Negative for congestion and sore throat.   Eyes: Negative for visual disturbance.  Respiratory: Positive for cough and shortness of breath. Negative for wheezing.   Cardiovascular: Positive for chest pain (with cough). Negative for leg swelling.  Gastrointestinal: Negative for abdominal pain, nausea and vomiting.  Genitourinary: Negative for difficulty urinating and dysuria.  Musculoskeletal: Negative for back pain and gait problem.  Skin: Negative for rash.  Neurological: Negative for dizziness, syncope and light-headedness.     Physical Exam Updated Vital Signs BP (!) 155/90   Pulse (!) 110   Temp 98.3 F (36.8 C) (Oral)   Resp (!) 23   Ht _0  (1.778 m)   Wt 102.1 kg (225 lb)   SpO2 98%   BMI 32.28 kg/m   Physical Exam  Constitutional: He appears well-developed and well-nourished. No distress.  NAD, non-toxic appearing  HENT:  Head: Normocephalic and atraumatic.  Mouth/Throat: Oropharynx is clear and moist. No oropharyngeal exudate.    Eyes: Pupils are equal, round, and reactive to light. Conjunctivae are normal. Right eye exhibits no discharge. Left eye exhibits no discharge.  Neck: Normal range of motion. Neck supple. No JVD present. No tracheal deviation present.  Cardiovascular:  Tachycardic, regular rate.  Pulmonary/Chest: Effort normal. No respiratory distress.  No respiratory distress, speaking in full sentences.  Faint expiratory crackles heard in bilateral bases.  Abdominal: Soft. Bowel sounds are normal. There is no tenderness. There is no guarding.  Musculoskeletal:  No leg swelling or calf tenderness.   Neurological: He is alert. Coordination normal.  Skin: Skin is warm and dry. He is not diaphoretic.  Psychiatric: He has a normal mood and affect. His behavior is normal.  Nursing note and vitals reviewed.    ED Treatments / Results  Labs (all labs ordered are listed, but only  abnormal results are displayed) Labs Reviewed  CBC WITH DIFFERENTIAL/PLATELET - Abnormal; Notable for the following components:      Result Value   RDW 16.6 (*)    All other components within normal limits  BRAIN NATRIURETIC PEPTIDE - Abnormal; Notable for the following components:   B Natriuretic Peptide 265.9 (*)    All other components within normal limits  BASIC METABOLIC PANEL - Abnormal; Notable for the following components:   Calcium 8.8 (*)    All other components within normal limits  HEMOGLOBIN A1C - Abnormal; Notable for the following components:   Hgb A1c MFr Bld 6.0 (*)    All other components within normal limits  D-DIMER, QUANTITATIVE (NOT AT [facility])  HIV ANTIBODY (ROUTINE TESTING)  BASIC METABOLIC PANEL  I-STAT TROPONIN, ED  I-STAT TROPONIN, ED    EKG EKG Interpretation  Date/Time:  Tuesday Dec 18 2017 03:16:59 EDT Ventricular Rate:  118 PR Interval:  128 QRS Duration: 144 QT Interval:  390 QTC Calculation: 546 R Axis:   123 Text Interpretation:  Sinus tachycardia Possible Left atrial enlargement  Right axis deviation Non-specific intra-ventricular conduction block Cannot rule out Anteroseptal infarct , age undetermined T wave abnormality, consider inferior ischemia Abnormal ECG Rate faster Changes probably rate related Confirmed by Thayer Jew [phone]) on 12/18/2017 3:22:46 AM Also confirmed by Thayer Jew [id number]), editor Hattie Perch [phone])  on 12/18/2017 7:03:37 AM   Radiology Dg Chest 2 View  Result Date: 12/18/2017 CLINICAL DATA:  42 y/o  M; shortness of breath. EXAM: CHEST - 2 VIEW COMPARISON:  11/02/2017 chest radiograph. FINDINGS: Stable mild cardiomegaly. Interval resolution of bilateral upper lobe pneumonia. Increased reticular opacities greatest in the lung bases. No focal consolidation, effusion, or pneumothorax. Bones are unremarkable. IMPRESSION: Mild cardiomegaly. Increase reticular opacities greatest in the lung bases, probably bronchitic changes, possibly vascular congestion. Electronically Signed   By: Kristine Garbe M.D.   On: 12/18/2017 04:54    Procedures Procedures (including critical care time)  Medications Ordered in ED Medications - No data to display   Initial Impression / Assessment and Plan / ED Course  I have reviewed the triage vital signs and the nursing notes.  Pertinent labs & imaging results that were available during my care of the patient were reviewed by me and considered in my medical decision making (see chart for details).     Patient presents to the ED reporting orthopnea and PND. No prior history of CHF. CXR reveals cardiomegaly and vascular congestion. On exam faint crackles heard in bilateral lung bases. No leg swelling.  BNP elevated 265.9. Patient tachycardic in the ED, d-dimer drawn which is negative therefore doubt PE.  Negative delta troponin, EKG nonischemic doubt ACS.  I ambulated the patient in the emergency department and he was persistently tachycardic to 120 and tachypneic with respirations 24.  Patient  reporting feeling short of breath, although did not become hypoxic.  Given patient with new CHF, and persistently tachycardic with sob with ambulation plan to admit patient to hospitalist service.  Discussed this patient with Dr. Bonner Puna who will admit patient.  This was a shared visit with Dr. Sherry Ruffing who agrees with plan to admit patient for new onset CHF.   Final Clinical Impressions(s) / ED Diagnoses   Final diagnoses:  None    ED Discharge Orders    None       Bernarda Caffey 12/18/17 2151    Tegeler, Gwenyth Allegra, MD 12/18/17 [phone]

## 2017-12-18 NOTE — ED Notes (Signed)
Pt ambulated to the restroom with out difficulty.

## 2017-12-18 NOTE — ED Notes (Signed)
Ambulated patient O2 sat at 99% no complaints while ambulating.

## 2017-12-19 ENCOUNTER — Other Ambulatory Visit: Payer: Self-pay

## 2017-12-19 ENCOUNTER — Observation Stay (HOSPITAL_BASED_OUTPATIENT_CLINIC_OR_DEPARTMENT_OTHER): Payer: BLUE CROSS/BLUE SHIELD

## 2017-12-19 ENCOUNTER — Encounter (HOSPITAL_COMMUNITY)
Admission: EM | Disposition: A | Discharge status: Home / Self Care | Payer: Self-pay | Source: Home / Self Care | Attending: Internal Medicine

## 2017-12-19 ENCOUNTER — Ambulatory Visit (HOSPITAL_COMMUNITY)
Admission: RE | Admit: 2017-12-19 | Payer: BLUE CROSS/BLUE SHIELD | Source: Ambulatory Visit | Admitting: Internal Medicine

## 2017-12-19 DIAGNOSIS — R0602 Shortness of breath: Secondary | ICD-10-CM

## 2017-12-19 DIAGNOSIS — E119 Type 2 diabetes mellitus without complications: Secondary | ICD-10-CM | POA: Diagnosis not present

## 2017-12-19 DIAGNOSIS — Z794 Long term (current) use of insulin: Secondary | ICD-10-CM | POA: Diagnosis not present

## 2017-12-19 DIAGNOSIS — R0603 Acute respiratory distress: Secondary | ICD-10-CM | POA: Diagnosis not present

## 2017-12-19 DIAGNOSIS — G4733 Obstructive sleep apnea (adult) (pediatric): Secondary | ICD-10-CM | POA: Diagnosis not present

## 2017-12-19 DIAGNOSIS — I509 Heart failure, unspecified: Secondary | ICD-10-CM | POA: Diagnosis not present

## 2017-12-19 DIAGNOSIS — I5021 Acute systolic (congestive) heart failure: Secondary | ICD-10-CM | POA: Diagnosis not present

## 2017-12-19 DIAGNOSIS — Z720 Tobacco use: Secondary | ICD-10-CM

## 2017-12-19 DIAGNOSIS — I340 Nonrheumatic mitral (valve) insufficiency: Secondary | ICD-10-CM | POA: Diagnosis not present

## 2017-12-19 DIAGNOSIS — I10 Essential (primary) hypertension: Secondary | ICD-10-CM | POA: Diagnosis not present

## 2017-12-19 HISTORY — PX: RIGHT/LEFT HEART CATH AND CORONARY ANGIOGRAPHY: CATH118266

## 2017-12-19 LAB — POCT I-STAT 3, VENOUS BLOOD GAS (G3P V)
Acid-Base Excess: 3 mmol/L — ABNORMAL HIGH (ref 0.0–2.0)
Acid-Base Excess: 5 mmol/L — ABNORMAL HIGH (ref 0.0–2.0)
BICARBONATE: 28.8 mmol/L — AB (ref 20.0–28.0)
BICARBONATE: 30.1 mmol/L — AB (ref 20.0–28.0)
O2 Saturation: 64 %
O2 Saturation: 66 %
PCO2 VEN: 47 mmHg (ref 44.0–60.0)
PCO2 VEN: 47.1 mmHg (ref 44.0–60.0)
PH VEN: 7.414 (ref 7.250–7.430)
PO2 VEN: 34 mmHg (ref 32.0–45.0)
TCO2: 30 mmol/L (ref 22–32)
TCO2: 32 mmol/L (ref 22–32)
pH, Ven: 7.395 (ref 7.250–7.430)
pO2, Ven: 34 mmHg (ref 32.0–45.0)

## 2017-12-19 LAB — ECHOCARDIOGRAM COMPLETE
Height: 70 in
WEIGHTICAEL: 3600 [oz_av]

## 2017-12-19 LAB — BASIC METABOLIC PANEL
ANION GAP: 8 (ref 5–15)
BUN: 14 mg/dL (ref 6–20)
CHLORIDE: 107 mmol/L (ref 101–111)
CO2: 26 mmol/L (ref 22–32)
CREATININE: 1.05 mg/dL (ref 0.61–1.24)
Calcium: 8.5 mg/dL — ABNORMAL LOW (ref 8.9–10.3)
GFR calc non Af Amer: >60 mL/min (ref >60)
Glucose, Bld: 97 mg/dL (ref 65–99)
Potassium: 3.7 mmol/L (ref 3.5–5.1)
SODIUM: 141 mmol/L (ref 135–145)

## 2017-12-19 LAB — POCT I-STAT 3, ART BLOOD GAS (G3+)
ACID-BASE DEFICIT: 1 mmol/L (ref 0.0–2.0)
Bicarbonate: 23.9 mmol/L (ref 20.0–28.0)
O2 Saturation: 97 %
PH ART: 7.41 (ref 7.350–7.450)
PO2 ART: 88 mmHg (ref 83.0–108.0)
TCO2: 25 mmol/L (ref 22–32)
pCO2 arterial: 37.8 mmHg (ref 32.0–48.0)

## 2017-12-19 LAB — CBC
HCT: 41.4 % (ref 39.0–52.0)
HEMOGLOBIN: 13.3 g/dL (ref 13.0–17.0)
MCH: 26.6 pg (ref 26.0–34.0)
MCHC: 32.1 g/dL (ref 30.0–36.0)
MCV: 82.8 fL (ref 78.0–100.0)
PLATELETS: 246 10*3/uL (ref 150–400)
RBC: 5 MIL/uL (ref 4.22–5.81)
RDW: 16 % — ABNORMAL HIGH (ref 11.5–15.5)
WBC: 5.7 10*3/uL (ref 4.0–10.5)

## 2017-12-19 LAB — CREATININE, SERUM
Creatinine, Ser: 1.13 mg/dL (ref 0.61–1.24)
GFR calc non Af Amer: >60 mL/min (ref >60)

## 2017-12-19 LAB — GLUCOSE, CAPILLARY
GLUCOSE-CAPILLARY: 101 mg/dL — AB (ref 65–99)
GLUCOSE-CAPILLARY: 117 mg/dL — AB (ref 65–99)
GLUCOSE-CAPILLARY: 85 mg/dL (ref 65–99)

## 2017-12-19 LAB — CBG MONITORING, ED
GLUCOSE-CAPILLARY: 88 mg/dL (ref 65–99)
Glucose-Capillary: 163 mg/dL — ABNORMAL HIGH (ref 65–99)

## 2017-12-19 LAB — HIV ANTIBODY (ROUTINE TESTING W REFLEX): HIV Screen 4th Generation wRfx: NONREACTIVE

## 2017-12-19 SURGERY — RIGHT/LEFT HEART CATH AND CORONARY ANGIOGRAPHY
Anesthesia: LOCAL

## 2017-12-19 MED ORDER — HEPARIN SODIUM (PORCINE) 1000 UNIT/ML IJ SOLN
INTRAMUSCULAR | Status: DC | PRN
  Administered 2017-12-19: 4500 [IU] via INTRAVENOUS

## 2017-12-19 MED ORDER — SODIUM CHLORIDE 0.9% FLUSH: 3.0000 mL | INTRAVENOUS | Status: DC | PRN

## 2017-12-19 MED ORDER — DIGOXIN 125 MCG PO TABS
0.1250 mg | ORAL_TABLET | Freq: Every day | ORAL | Status: DC
  Administered 2017-12-19 – 2017-12-20 (×2): 0.125 mg via ORAL
  Filled 2017-12-19 (×2): qty 1

## 2017-12-19 MED ORDER — ENOXAPARIN SODIUM 40 MG/0.4ML ~~LOC~~ SOLN
40.0000 mg | SUBCUTANEOUS | Status: DC
  Filled 2017-12-19: qty 0.4

## 2017-12-19 MED ORDER — SPIRONOLACTONE 12.5 MG HALF TABLET
12.5000 mg | ORAL_TABLET | Freq: Every day | ORAL | Status: DC
  Administered 2017-12-19 – 2017-12-20 (×2): 12.5 mg via ORAL
  Filled 2017-12-19 (×2): qty 1

## 2017-12-19 MED ORDER — SODIUM CHLORIDE 0.9% FLUSH: 3.0000 mL | Freq: Two times a day (BID) | INTRAVENOUS | Status: DC

## 2017-12-19 MED ORDER — ACETAMINOPHEN 325 MG PO TABS: 650.0000 mg | ORAL_TABLET | ORAL | Status: DC | PRN

## 2017-12-19 MED ORDER — SODIUM CHLORIDE 0.9 % IV SOLN
INTRAVENOUS | Status: AC | PRN
  Administered 2017-12-19: 10 mL/h via INTRAVENOUS

## 2017-12-19 MED ORDER — ASPIRIN 81 MG PO CHEW
81.0000 mg | CHEWABLE_TABLET | ORAL | Status: AC
  Administered 2017-12-19: 81 mg via ORAL
  Filled 2017-12-19: qty 1

## 2017-12-19 MED ORDER — HEPARIN (PORCINE) IN NACL 2-0.9 UNITS/ML
INTRAMUSCULAR | Status: AC | PRN
  Administered 2017-12-19: 500 mL

## 2017-12-19 MED ORDER — MIDAZOLAM HCL 2 MG/2ML IJ SOLN
INTRAMUSCULAR | Status: DC | PRN
  Administered 2017-12-19: 2 mg via INTRAVENOUS

## 2017-12-19 MED ORDER — SODIUM CHLORIDE 0.9 % IV SOLN: 250.0000 mL | INTRAVENOUS | Status: DC | PRN

## 2017-12-19 MED ORDER — SODIUM CHLORIDE 0.9 % IV SOLN: INTRAVENOUS | Status: DC

## 2017-12-19 MED ORDER — POTASSIUM CHLORIDE CRYS ER 20 MEQ PO TBCR
40.0000 meq | EXTENDED_RELEASE_TABLET | Freq: Once | ORAL | Status: AC
  Administered 2017-12-19: 40 meq via ORAL
  Filled 2017-12-19: qty 2

## 2017-12-19 MED ORDER — SODIUM CHLORIDE 0.9% FLUSH
3.0000 mL | Freq: Two times a day (BID) | INTRAVENOUS | Status: DC
  Administered 2017-12-20: 3 mL via INTRAVENOUS

## 2017-12-19 MED ORDER — IOHEXOL 350 MG/ML SOLN
INTRAVENOUS | Status: DC | PRN
  Administered 2017-12-19: 55 mL via INTRA_ARTERIAL

## 2017-12-19 MED ORDER — VERAPAMIL HCL 2.5 MG/ML IV SOLN
INTRAVENOUS | Status: DC | PRN
  Administered 2017-12-19: 15:00:00 via INTRA_ARTERIAL

## 2017-12-19 MED ORDER — FUROSEMIDE 10 MG/ML IJ SOLN
40.0000 mg | Freq: Two times a day (BID) | INTRAMUSCULAR | Status: AC
  Administered 2017-12-19 – 2017-12-20 (×2): 40 mg via INTRAVENOUS
  Filled 2017-12-19 (×2): qty 4

## 2017-12-19 MED ORDER — LOSARTAN POTASSIUM 25 MG PO TABS
25.0000 mg | ORAL_TABLET | Freq: Two times a day (BID) | ORAL | Status: DC
  Administered 2017-12-19 – 2017-12-20 (×2): 25 mg via ORAL
  Filled 2017-12-19 (×3): qty 1

## 2017-12-19 MED ORDER — SODIUM CHLORIDE 0.9 % IV SOLN: INTRAVENOUS | Status: AC

## 2017-12-19 MED ORDER — LIDOCAINE HCL (PF) 1 % IJ SOLN
INTRAMUSCULAR | Status: DC | PRN
  Administered 2017-12-19 (×2): 2 mL

## 2017-12-19 MED ORDER — ONDANSETRON HCL 4 MG/2ML IJ SOLN: 4.0000 mg | Freq: Four times a day (QID) | INTRAMUSCULAR | Status: DC | PRN

## 2017-12-19 MED ORDER — FENTANYL CITRATE (PF) 100 MCG/2ML IJ SOLN
INTRAMUSCULAR | Status: DC | PRN
  Administered 2017-12-19: 25 ug via INTRAVENOUS

## 2017-12-19 SURGICAL SUPPLY — 15 items
CATH BALLN WEDGE 5F 110CM (CATHETERS) ×2 IMPLANT
CATH INFINITI 5 FR JL3.5 (CATHETERS) ×2 IMPLANT
CATH INFINITI JR4 5F (CATHETERS) ×2 IMPLANT
COVER PRB 48X5XTLSCP FOLD TPE (BAG) ×1 IMPLANT
COVER PROBE 5X48 (BAG) ×1
DEVICE RAD COMP TR BAND LRG (VASCULAR PRODUCTS) ×2 IMPLANT
GUIDEWIRE INQWIRE 1.5J.035X260 (WIRE) ×1 IMPLANT
INQWIRE 1.5J .035X260CM (WIRE) ×2
KIT HEART LEFT (KITS) ×2 IMPLANT
KIT MICROPUNCTURE NIT STIFF (SHEATH) ×2 IMPLANT
NEEDLE PERC 21GX4CM (NEEDLE) ×2 IMPLANT
PACK CARDIAC CATHETERIZATION (CUSTOM PROCEDURE TRAY) ×2 IMPLANT
SHEATH GLIDE SLENDER 4/5FR (SHEATH) ×2 IMPLANT
SHEATH RAIN RADIAL 21G 6FR (SHEATH) ×2 IMPLANT
TRANSDUCER W/STOPCOCK (MISCELLANEOUS) ×2 IMPLANT

## 2017-12-19 NOTE — Interval H&P Note (Signed)
History and Physical Interval Note:  12/19/2017 2:29 PM  Kevin Yates  has presented today for surgery, with the diagnosis of sob  The various methods of treatment have been discussed with the patient and family. After consideration of risks, benefits and other options for treatment, the patient has consented to  Procedure(s): RIGHT/LEFT HEART CATH AND CORONARY ANGIOGRAPHY (N/A) and possible coronary angioplasty as a surgical intervention .  The patient's history has been reviewed, patient examined, no change in status, stable for surgery.  I have reviewed the patient's chart and labs.  Questions were answered to the patient's satisfaction.     Kirstein Baxley

## 2017-12-19 NOTE — H&P (View-Only) (Signed)
Advanced Heart Failure Team Consult Note   Primary Physician: Dorena Bodo, PA-C PCP-Cardiologist:  No primary care provider on file.  Reason for Consultation: New systolic CHF  HPI:    Kevin Yates is seen today for evaluation of newly-diagnosed systolic CHF at the request of Dr. Benjamine Mola.   Kevin Yates is a 42 y.o. male with HTN, IDT2DM, HLD, OSA not on CPAP, Tobacco abuse, and Substance abuse.   Previously admitted 10/2017 for multifocal PNA. Had been treated for the flu earlier in the month. Completed course of ABX and discharged home.  Says breathing never really got better after his URI. Over past week, pt has had worsening PND and orthopnea. Has associated dry cough and sharp pain and pleuritic chest pain. He became acutely worse 12/18/17 and presented to [facility]. Noted to be tachycardic and hypertensive on exam, with crackles and increased WOB. ECG with LBBB ( ) and sinus tach. BNP 265, Cr 0.96, BUN 14, K 3.6. D-dimer WNL. Echo as below with new diagnosis of systolic CHF EF 15-20% with mild to moderate RV dysfunction. HF team consulted for further evaluation and treatment.   Feeling somewhat better. Works in a [employer]. Has had decreasing functional status over past month or so since his respiratory illness.  He denies any history of heart problems. Doesn't check BP or weight very often. Snores heavily, CPAP machine was $1100 so unable to afford. He was not on chronic meds PTA. Smokes ~ 0.25 ppd. Drinks a pint of liquor on the weekends but none otherwise. No FHx of CHF. He denies exertional CP. Diagnosed with Dm2 several months ago but now sugars better controlled.   Echo 12/19/17 LVEF 25-30%, Diffuse HK, Mild MR, Mild LAE, Mild RV dilation, IVC WNL.  (I viewed personally and EF ~20%)   Review of Systems: [y] = yes, [ ]  = no   General: Weight gain [ ] ; Weight loss [ ] ; Anorexia [ ] ; Fatigue [y]; Fever [ ] ; Chills [ ] ; Weakness [ ]   Cardiac: Chest pain/pressure [ ] ; Resting SOB [y];  Exertional SOB [y]; Orthopnea [y]; Pedal Edema [ ] ; Palpitations [ ] ; Syncope [ ] ; Presyncope [ ] ; Paroxysmal nocturnal dyspnea[y]  Pulmonary: Cough [y]; Wheezing[ ] ; Hemoptysis[ ] ; Sputum [ ] ; Snoring [ ]   GI: Vomiting[ ] ; Dysphagia[ ] ; Melena[ ] ; Hematochezia [ ] ; Heartburn[ ] ; Abdominal pain [ ] ; Constipation [ ] ; Diarrhea [ ] ; BRBPR [ ]   GU: Hematuria[ ] ; Dysuria [ ] ; Nocturia[ ]   Vascular: Pain in legs with walking [ ] ; Pain in feet with lying flat [ ] ; Non-healing sores [ ] ; Stroke [ ] ; TIA [ ] ; Slurred speech [ ] ;  Neuro: Headaches[ ] ; Vertigo[ ] ; Seizures[ ] ; Paresthesias[ ] ;Blurred vision [ ] ; Diplopia [ ] ; Vision changes [ ]   Ortho/Skin: Arthritis [ y]; Joint pain [ y]; Muscle pain [ ] ; Joint swelling [ ] ; Back Pain [ ] ; Rash [ ]   Psych: Depression[ ] ; Anxiety[ ]   Heme: Bleeding problems [ ] ; Clotting disorders [ ] ; Anemia [ ]   Endocrine: Diabetes Cove.Etienne ]; Thyroid dysfunction[ ]   Home Medications Prior to Admission medications   Medication Sig Start Date End Date Taking? Authorizing Provider  Insulin Detemir (LEVEMIR FLEXTOUCH) 100 UNIT/ML Pen Inject 50 Units daily at 10 pm into the skin. 06/19/17  Yes Allayne Butcher B, PA-C  lisinopril (PRINIVIL,ZESTRIL) 10 MG tablet TAKE 1 TABLET BY MOUTH EVERY DAY 11/05/17  Yes Allayne Butcher B, PA-C  metoprolol succinate (TOPROL-XL) 25 MG 24 hr tablet TAKE  1 TABLET BY MOUTH EVERY DAY 11/05/17  Yes Allayne Butcher B, PA-C  simvastatin (ZOCOR) 10 MG tablet TAKE 1 TABLET BY MOUTH EVERYDAY AT BEDTIME 11/15/17  Yes Allayne Butcher B, PA-C  acetaZOLAMIDE (DIAMOX) 500 MG capsule Take 1 capsule (500 mg total) by mouth 2 (two) times daily. Patient not taking: Reported on 12/18/2017 09/21/17   Drema Dallas, DO  cyclobenzaprine (FLEXERIL) 10 MG tablet Take 1 tablet (10 mg total) by mouth 3 (three) times daily as needed for muscle spasms. Patient not taking: Reported on 09/21/2017 05/24/17   Allayne Butcher B, PA-C  doxycycline (VIBRAMYCIN) 100 MG capsule Take 1 capsule (100 mg total) by  mouth 2 (two) times daily. One po bid x 7 days Patient not taking: Reported on 12/18/2017 11/02/17   Bethann Berkshire, MD  Guaifenesin 1200 MG TB12 Take 1 tablet (1,200 mg total) by mouth 2 (two) times daily. Patient not taking: Reported on 12/18/2017 07/17/17   Charlestine Night, PA-C  meloxicam (MOBIC) 7.5 MG tablet TAKE 1 TABLET BY MOUTH EVERY DAY Patient not taking: Reported on 12/18/2017 06/28/17   Allayne Butcher B, PA-C  pioglitazone (ACTOS) 45 MG tablet Take 1 tablet (45 mg total) by mouth daily. Patient not taking: Reported on 12/18/2017 05/24/17   Deon Pilling    Past Medical History: Past Medical History:  Diagnosis Date  . Diabetes mellitus without complication (HCC)   . Hypertension     Past Surgical History: History reviewed. No pertinent surgical history.  Family History: Family History  Problem Relation Age of Onset  . Hypertension Mother   . Hypertension Father     Social History: Social History   Socioeconomic History  . Marital status: Married    Spouse name: Not on file  . Number of children: 1  . Years of education: 28  . Highest education level: Not on file  Occupational History  . Not on file  Social Needs  . Financial resource strain: Not on file  . Food insecurity:    Worry: Not on file    Inability: Not on file  . Transportation needs:    Medical: Not on file    Non-medical: Not on file  Tobacco Use  . Smoking status: Current Some Day Smoker    Packs/day: 0.50    Types: Cigarettes    Last attempt to quit: 05/14/2017    Years since quitting: 0.6  . Smokeless tobacco: Never Used  Substance and Sexual Activity  . Alcohol use: Yes    Alcohol/week: 4.8 oz    Types: 8 Standard drinks or equivalent per week    Comment: mostly on weekend  . Drug use: No  . Sexual activity: Yes    Partners: Female  Lifestyle  . Physical activity:    Days per week: Not on file    Minutes per session: Not on file  . Stress: Not on file  Relationships  . Social  connections:    Talks on phone: Not on file    Gets together: Not on file    Attends religious service: Not on file    Active member of club or organization: Not on file    Attends meetings of clubs or organizations: Not on file    Relationship status: Not on file  Other Topics Concern  . Not on file  Social History Narrative   Lives with wife and daughter in a 2 story home.  Has one daughter.  Works on used cars.  Education: college.  Allergies:  Allergies  Allergen Reactions  . Metformin And Related Diarrhea    After one dose, developed abdominal pain and diarrhea---12/14/2015    Objective:    Vital Signs:   Temp:  [98 F (36.7 C)] 98 F (36.7 C) (05/08 1045) Pulse Rate:  [91-112] 92 (05/08 1122) Resp:  [10-24] 18 (05/08 1045) BP: (110-155)/(85-110) 129/98 (05/08 1122) SpO2:  [95 %-100 %] 100 % (05/08 1045) Weight:  [210 lb 9.6 oz (95.5 kg)] 210 lb 9.6 oz (95.5 kg) (05/08 1045) Last BM Date: 12/19/17  Weight change: Filed Weights   12/18/17 0312 12/19/17 1045  Weight: 225 lb (102.1 kg) 210 lb 9.6 oz (95.5 kg)    Intake/Output:   Intake/Output Summary (Last 24 hours) at 12/19/2017 1201 Last data filed at 12/19/2017 1100 Gross per 24 hour  Intake 240 ml  Output -  Net 240 ml      Physical Exam    General:  Well appearing. No resp difficulty HEENT: normal. Anicteric  Neck: supple. JVP 6-7. Carotids 2+ bilat; no bruits. No lymphadenopathy or thyromegaly appreciated. Cor: PMI nondisplaced. Regular, slightly tachy. No rubs, gallops or murmurs. Lungs: clear no wheeze  Abdomen: soft, nontender, nondistended. No hepatosplenomegaly. No bruits or masses. Good bowel sounds Extremities: no cyanosis, clubbing, rash, edema Neuro: alert & oriented x 3, cranial nerves grossly intact. moves all 4 extremities w/o difficulty. Affect pleasant  Telemetry   Sinus tach 100s, personally reviewed.  EKG    Sinus Tach 107 bpm, LBBB 147 ms, personally reviewed.    Labs   Basic  Metabolic Panel: Recent Labs  Lab 12/18/17 1752 12/19/17 0342  NA 140 141  K 3.6 3.7  CL 106 107  CO2 25 26  GLUCOSE 87 97  BUN 8 14  CREATININE 0.96 1.05  CALCIUM 8.8* 8.5*    Liver Function Tests: No results for input(s): AST, ALT, ALKPHOS, BILITOT, PROT, ALBUMIN in the last 168 hours. No results for input(s): LIPASE, AMYLASE in the last 168 hours. No results for input(s): AMMONIA in the last 168 hours.  CBC: Recent Labs  Lab 12/18/17 0332  WBC 7.2  NEUTROABS 3.6  HGB 13.6  HCT 41.0  MCV 82.8  PLT 258    Cardiac Enzymes: No results for input(s): CKTOTAL, CKMB, CKMBINDEX, TROPONINI in the last 168 hours.  BNP: BNP (last 3 results) Recent Labs    07/17/17 1244 12/18/17 0332  BNP 275.2* 265.9*    ProBNP (last 3 results) No results for input(s): PROBNP in the last 8760 hours.   CBG: Recent Labs  Lab 12/18/17 2206 12/19/17 0556 12/19/17 0755  GLUCAP 107* 88 163*    Coagulation Studies: No results for input(s): LABPROT, INR in the last 72 hours.   Imaging    No results found.   Medications:     Current Medications: . enoxaparin (LOVENOX) injection  40 mg Subcutaneous Q24H  . insulin aspart  0-15 Units Subcutaneous TID WC  . insulin aspart  0-5 Units Subcutaneous QHS  . insulin detemir  40 Units Subcutaneous QHS  . lisinopril  10 mg Oral Daily  . metoprolol succinate  25 mg Oral Daily  . simvastatin  10 mg Oral q1800  . sodium chloride flush  3 mL Intravenous Q12H     Infusions:   Patient Profile   Kevin Yates is a 42 y.o. male with HTN, IDT2DM, HLD, OSA not on CPAP, Tobacco abuse, and Substance abuse.   Admitted with 3-5 days of new onset dyspnea. Echo  with new systolic CHF  Assessment/Plan   1. Acute systolic CHF - Echo 12/19/17 LVEF 25-30%, Diffuse HK, Mild MR, Mild LAE, Mild RV dilation, IVC WNL.  - EF may even be as low as 20% with global HK - Unclear etiology. Will need R/LHC to determine CAD and filling pressures. DDx  includes but not limited to CAD, HTN, OSA, or Viral CMP. - NYHA III symptoms - Volume status looks relatively stable.   - Stop lisinopril. Start losartan 25 mg BID in anticipation of Entresto.  - Continue Toprol 25 mg daily for now. If output low on cath, low threshold to stop.  - Start digoxin 0.125 mg daily. - Start spiro 12.5 mg daily.  - Can consider Bidil down the road. - If coronary angiography negative may need cMRI. (Will need anxiolytic)  2. HTN - Will adjust meds in setting of treating his CHF  3. OSA  - + Sleep Study 2018 - Non-compliant with CPAP due to cost.  - Suspect large contributor to his CMP.   4. HLD - Will need PCP  5. DM2 - Well controlled. Hgb A1c this admit 6.0. - Per primary.  6. Tobacco abuse - Encouraged complete cessation.   Will need [facility] tomorrow. Will titrate HF medications as tolerated. Will need repeat Echo in 3-4 months for ICD consideration.   Medication concerns reviewed with patient and pharmacy team. Barriers identified: Compliance and substance abuse.   Length of Stay: 0  Luane School  12/19/2017, 12:01 PM  Advanced Heart Failure Team Pager [phone] (M-F; 7a - 4p)  Please contact [facility] for night-coverage after hours (4p -7a ) and weekends on [email or web address]  42 y/o mal with h/o HTN, DM, untreated OSA and LBBB admitted with new diagnosis of systolic HF 1 month post viral URI. Now with NYHA III symptoms. Volume looks ok. Echo viewed personally. EF ~20%. No RWMA. On exam BP with narrow pulse pressure. JVP not significantly elevated. No s3. Extremities warm with no edema.   Possible etiologies of CM: CAD, viral, HTN, OSA or LBBB.   Will plan R/L cath today followed by cMRI tomorrow. Change HF meds as above. We will follow.   Arvilla Meres, MD  2:01 PM

## 2017-12-19 NOTE — ED Notes (Signed)
Heart healthy/carb modified tray ordered for breakfast  

## 2017-12-19 NOTE — Care Management Note (Signed)
Case Management Note  Patient Details  Name: Kevin Yates MRN: [id number] Date of Birth: March 27, 1976   Action/Plan: Patient is independent prior to admission; PCP: Deon Pilling; has private insurance with BCBS with prescription drug coverage; patient needs CPAP machine; his Pulmonologist is working on this; he had his sleep study in December 2018, he is having difficulty affording his co pay for the cpap; CM informed patient to continue to work with his Diplomatic Services operational officer and his insurance company; CM cannot assist patient with his co pay for the CPAP. CM will continue to follow for progression of care,  Expected Discharge Date:  Possibly 12/23/2017              Expected Discharge Plan:  Home/Self Care  Discharge planning Services  CM Consult  Status of Service:  In process, will continue to follow  Reola Mosher [phone] 12/19/2017, 12:55 PM

## 2017-12-19 NOTE — ED Notes (Signed)
attempted report 

## 2017-12-19 NOTE — Consult Note (Addendum)
Advanced Heart Failure Team Consult Note   Primary Physician: Dorena Bodo, PA-C PCP-Cardiologist:  No primary care provider on file.  Reason for Consultation: New systolic CHF  HPI:    Kevin Yates is seen today for evaluation of newly-diagnosed systolic CHF at the request of Dr. Benjamine Mola.   Kevin Yates is a 42 y.o. male with HTN, IDT2DM, HLD, OSA not on CPAP, Tobacco abuse, and Substance abuse.   Previously admitted 10/2017 for multifocal PNA. Had been treated for the flu earlier in the month. Completed course of ABX and discharged home.  Says breathing never really got better after his URI. Over past week, pt has had worsening PND and orthopnea. Has associated dry cough and sharp pain and pleuritic chest pain. He became acutely worse 12/18/17 and presented to [facility]. Noted to be tachycardic and hypertensive on exam, with crackles and increased WOB. ECG with LBBB ( ) and sinus tach. BNP 265, Cr 0.96, BUN 14, K 3.6. D-dimer WNL. Echo as below with new diagnosis of systolic CHF EF 15-20% with mild to moderate RV dysfunction. HF team consulted for further evaluation and treatment.   Feeling somewhat better. Works in a [employer]. Has had decreasing functional status over past month or so since his respiratory illness.  He denies any history of heart problems. Doesn't check BP or weight very often. Snores heavily, CPAP machine was $1100 so unable to afford. He was not on chronic meds PTA. Smokes ~ 0.25 ppd. Drinks a pint of liquor on the weekends but none otherwise. No FHx of CHF. He denies exertional CP. Diagnosed with Dm2 several months ago but now sugars better controlled.   Echo 12/19/17 LVEF 25-30%, Diffuse HK, Mild MR, Mild LAE, Mild RV dilation, IVC WNL.  (I viewed personally and EF ~20%)   Review of Systems: [y] = yes, [ ]  = no   General: Weight gain [ ] ; Weight loss [ ] ; Anorexia [ ] ; Fatigue [y]; Fever [ ] ; Chills [ ] ; Weakness [ ]   Cardiac: Chest pain/pressure [ ] ; Resting SOB [y];  Exertional SOB [y]; Orthopnea [y]; Pedal Edema [ ] ; Palpitations [ ] ; Syncope [ ] ; Presyncope [ ] ; Paroxysmal nocturnal dyspnea[y]  Pulmonary: Cough [y]; Wheezing[ ] ; Hemoptysis[ ] ; Sputum [ ] ; Snoring [ ]   GI: Vomiting[ ] ; Dysphagia[ ] ; Melena[ ] ; Hematochezia [ ] ; Heartburn[ ] ; Abdominal pain [ ] ; Constipation [ ] ; Diarrhea [ ] ; BRBPR [ ]   GU: Hematuria[ ] ; Dysuria [ ] ; Nocturia[ ]   Vascular: Pain in legs with walking [ ] ; Pain in feet with lying flat [ ] ; Non-healing sores [ ] ; Stroke [ ] ; TIA [ ] ; Slurred speech [ ] ;  Neuro: Headaches[ ] ; Vertigo[ ] ; Seizures[ ] ; Paresthesias[ ] ;Blurred vision [ ] ; Diplopia [ ] ; Vision changes [ ]   Ortho/Skin: Arthritis [ y]; Joint pain [ y]; Muscle pain [ ] ; Joint swelling [ ] ; Back Pain [ ] ; Rash [ ]   Psych: Depression[ ] ; Anxiety[ ]   Heme: Bleeding problems [ ] ; Clotting disorders [ ] ; Anemia [ ]   Endocrine: Diabetes [place].Etienne ]; Thyroid dysfunction[ ]   Home Medications Prior to Admission medications   Medication Sig Start Date End Date Taking? Authorizing Provider  Insulin Detemir (LEVEMIR FLEXTOUCH) 100 UNIT/ML Pen Inject 50 Units daily at 10 pm into the skin. 06/19/17  Yes Allayne Butcher B, PA-C  lisinopril (PRINIVIL,ZESTRIL) 10 MG tablet TAKE 1 TABLET BY MOUTH EVERY DAY 11/05/17  Yes Allayne Butcher B, PA-C  metoprolol succinate (TOPROL-XL) 25 MG 24 hr tablet TAKE  1 TABLET BY MOUTH EVERY DAY 11/05/17  Yes Allayne Butcher B, PA-C  simvastatin (ZOCOR) 10 MG tablet TAKE 1 TABLET BY MOUTH EVERYDAY AT BEDTIME 11/15/17  Yes Allayne Butcher B, PA-C  acetaZOLAMIDE (DIAMOX) 500 MG capsule Take 1 capsule (500 mg total) by mouth 2 (two) times daily. Patient not taking: Reported on 12/18/2017 09/21/17   Drema Dallas, DO  cyclobenzaprine (FLEXERIL) 10 MG tablet Take 1 tablet (10 mg total) by mouth 3 (three) times daily as needed for muscle spasms. Patient not taking: Reported on 09/21/2017 05/24/17   Allayne Butcher B, PA-C  doxycycline (VIBRAMYCIN) 100 MG capsule Take 1 capsule (100 mg total) by  mouth 2 (two) times daily. One po bid x 7 days Patient not taking: Reported on 12/18/2017 11/02/17   Bethann Berkshire, MD  Guaifenesin 1200 MG TB12 Take 1 tablet (1,200 mg total) by mouth 2 (two) times daily. Patient not taking: Reported on 12/18/2017 07/17/17   Charlestine Night, PA-C  meloxicam (MOBIC) 7.5 MG tablet TAKE 1 TABLET BY MOUTH EVERY DAY Patient not taking: Reported on 12/18/2017 06/28/17   Allayne Butcher B, PA-C  pioglitazone (ACTOS) 45 MG tablet Take 1 tablet (45 mg total) by mouth daily. Patient not taking: Reported on 12/18/2017 05/24/17   Deon Pilling    Past Medical History: Past Medical History:  Diagnosis Date  . Diabetes mellitus without complication (HCC)   . Hypertension     Past Surgical History: History reviewed. No pertinent surgical history.  Family History: Family History  Problem Relation Age of Onset  . Hypertension Mother   . Hypertension Father     Social History: Social History   Socioeconomic History  . Marital status: Married    Spouse name: Not on file  . Number of children: 1  . Years of education: 28  . Highest education level: Not on file  Occupational History  . Not on file  Social Needs  . Financial resource strain: Not on file  . Food insecurity:    Worry: Not on file    Inability: Not on file  . Transportation needs:    Medical: Not on file    Non-medical: Not on file  Tobacco Use  . Smoking status: Current Some Day Smoker    Packs/day: 0.50    Types: Cigarettes    Last attempt to quit: 05/14/2017    Years since quitting: 0.6  . Smokeless tobacco: Never Used  Substance and Sexual Activity  . Alcohol use: Yes    Alcohol/week: 4.8 oz    Types: 8 Standard drinks or equivalent per week    Comment: mostly on weekend  . Drug use: No  . Sexual activity: Yes    Partners: Female  Lifestyle  . Physical activity:    Days per week: Not on file    Minutes per session: Not on file  . Stress: Not on file  Relationships  . Social  connections:    Talks on phone: Not on file    Gets together: Not on file    Attends religious service: Not on file    Active member of club or organization: Not on file    Attends meetings of clubs or organizations: Not on file    Relationship status: Not on file  Other Topics Concern  . Not on file  Social History Narrative   Lives with wife and daughter in a 2 story home.  Has one daughter.  Works on used cars.  Education: college.  Allergies:  Allergies  Allergen Reactions  . Metformin And Related Diarrhea    After one dose, developed abdominal pain and diarrhea---12/14/2015    Objective:    Vital Signs:   Temp:  [98 F (36.7 C)] 98 F (36.7 C) (05/08 1045) Pulse Rate:  [91-112] 92 (05/08 1122) Resp:  [10-24] 18 (05/08 1045) BP: (110-155)/(85-110) 129/98 (05/08 1122) SpO2:  [95 %-100 %] 100 % (05/08 1045) Weight:  [210 lb 9.6 oz (95.5 kg)] 210 lb 9.6 oz (95.5 kg) (05/08 1045) Last BM Date: 12/19/17  Weight change: Filed Weights   12/18/17 0312 12/19/17 1045  Weight: 225 lb (102.1 kg) 210 lb 9.6 oz (95.5 kg)    Intake/Output:   Intake/Output Summary (Last 24 hours) at 12/19/2017 1201 Last data filed at 12/19/2017 1100 Gross per 24 hour  Intake 240 ml  Output -  Net 240 ml      Physical Exam    General:  Well appearing. No resp difficulty HEENT: normal. Anicteric  Neck: supple. JVP 6-7. Carotids 2+ bilat; no bruits. No lymphadenopathy or thyromegaly appreciated. Cor: PMI nondisplaced. Regular, slightly tachy. No rubs, gallops or murmurs. Lungs: clear no wheeze  Abdomen: soft, nontender, nondistended. No hepatosplenomegaly. No bruits or masses. Good bowel sounds Extremities: no cyanosis, clubbing, rash, edema Neuro: alert & oriented x 3, cranial nerves grossly intact. moves all 4 extremities w/o difficulty. Affect pleasant  Telemetry   Sinus tach 100s, personally reviewed.  EKG    Sinus Tach 107 bpm, LBBB 147 ms, personally reviewed.    Labs   Basic  Metabolic Panel: Recent Labs  Lab 12/18/17 1752 12/19/17 0342  NA 140 141  K 3.6 3.7  CL 106 107  CO2 25 26  GLUCOSE 87 97  BUN 8 14  CREATININE 0.96 1.05  CALCIUM 8.8* 8.5*    Liver Function Tests: No results for input(s): AST, ALT, ALKPHOS, BILITOT, PROT, ALBUMIN in the last 168 hours. No results for input(s): LIPASE, AMYLASE in the last 168 hours. No results for input(s): AMMONIA in the last 168 hours.  CBC: Recent Labs  Lab 12/18/17 0332  WBC 7.2  NEUTROABS 3.6  HGB 13.6  HCT 41.0  MCV 82.8  PLT 258    Cardiac Enzymes: No results for input(s): CKTOTAL, CKMB, CKMBINDEX, TROPONINI in the last 168 hours.  BNP: BNP (last 3 results) Recent Labs    07/17/17 1244 12/18/17 0332  BNP 275.2* 265.9*    ProBNP (last 3 results) No results for input(s): PROBNP in the last 8760 hours.   CBG: Recent Labs  Lab 12/18/17 2206 12/19/17 0556 12/19/17 0755  GLUCAP 107* 88 163*    Coagulation Studies: No results for input(s): LABPROT, INR in the last 72 hours.   Imaging    No results found.   Medications:     Current Medications: . enoxaparin (LOVENOX) injection  40 mg Subcutaneous Q24H  . insulin aspart  0-15 Units Subcutaneous TID WC  . insulin aspart  0-5 Units Subcutaneous QHS  . insulin detemir  40 Units Subcutaneous QHS  . lisinopril  10 mg Oral Daily  . metoprolol succinate  25 mg Oral Daily  . simvastatin  10 mg Oral q1800  . sodium chloride flush  3 mL Intravenous Q12H     Infusions:   Patient Profile   Kevin Yates is a 42 y.o. male with HTN, IDT2DM, HLD, OSA not on CPAP, Tobacco abuse, and Substance abuse.   Admitted with 3-5 days of new onset dyspnea. Echo  with new systolic CHF  Assessment/Plan   1. Acute systolic CHF - Echo 12/19/17 LVEF 25-30%, Diffuse HK, Mild MR, Mild LAE, Mild RV dilation, IVC WNL.  - EF may even be as low as 20% with global HK - Unclear etiology. Will need R/LHC to determine CAD and filling pressures. DDx  includes but not limited to CAD, HTN, OSA, or Viral CMP. - NYHA III symptoms - Volume status looks relatively stable.   - Stop lisinopril. Start losartan 25 mg BID in anticipation of Entresto.  - Continue Toprol 25 mg daily for now. If output low on cath, low threshold to stop.  - Start digoxin 0.125 mg daily. - Start spiro 12.5 mg daily.  - Can consider Bidil down the road. - If coronary angiography negative may need cMRI. (Will need anxiolytic)  2. HTN - Will adjust meds in setting of treating his CHF  3. OSA  - + Sleep Study 2018 - Non-compliant with CPAP due to cost.  - Suspect large contributor to his CMP.   4. HLD - Will need PCP  5. DM2 - Well controlled. Hgb A1c this admit 6.0. - Per primary.  6. Tobacco abuse - Encouraged complete cessation.   Will need [facility] tomorrow. Will titrate HF medications as tolerated. Will need repeat Echo in 3-4 months for ICD consideration.   Medication concerns reviewed with patient and pharmacy team. Barriers identified: Compliance and substance abuse.   Length of Stay: 0  Kevin Yates  12/19/2017, 12:01 PM  Advanced Heart Failure Team Pager [phone] (M-F; 7a - 4p)  Please contact [facility] for night-coverage after hours (4p -7a ) and weekends on [email or web address]  42 y/o mal with h/o HTN, DM, untreated OSA and LBBB admitted with new diagnosis of systolic HF 1 month post viral URI. Now with NYHA III symptoms. Volume looks ok. Echo viewed personally. EF ~20%. No RWMA. On exam BP with narrow pulse pressure. JVP not significantly elevated. No s3. Extremities warm with no edema.   Possible etiologies of CM: CAD, viral, HTN, OSA or LBBB.   Will plan R/L cath today followed by cMRI tomorrow. Change HF meds as above. We will follow.   Arvilla Meres, MD  2:01 PM

## 2017-12-19 NOTE — Progress Notes (Signed)
  Echocardiogram 2D Echocardiogram has been performed.  Kevin Yates 12/19/2017, 9:13 AM

## 2017-12-19 NOTE — ED Notes (Signed)
Pt had breakfast before taking CBG

## 2017-12-19 NOTE — Progress Notes (Signed)
PROGRESS NOTE    Kevin Yates  ZOX:[id number] DOB: 1975/09/10 DOA: 12/18/2017 PCP: Dorena Bodo, PA-C   Outpatient Specialists:     Brief Narrative:  Kevin Yates is a 42 y.o. male with a history of IDT2DM, OSA not on CPAP, HTN, HLD who presented to the ED due to progressive dyspnea worst at night.   He reports 4-5 nights with episodes where he wakes up suddenly short of breath which improves when sitting up, associated with a dry cough which is associated with sharp pain across his chest only when coughing. He always sleeps propped up on pillows but notes increasing propping up has helped symptoms. Since the episodes have become more intense he decided to come to the ED for evaluation.   He was evaluated for dyspnea, cough and fever on 3/22, found to have multifocal pneumonia and completed a course of doxycycline with improvement. He had some wheezing at that time, and has this intermittently since. Has +sleep study last year but has not been able to afford a CPAP, reports significant daytime drowsiness.     Assessment & Plan:   Principal Problem:   Acute respiratory distress Active Problems:   Diabetes mellitus type 2, uncomplicated (HCC)   Essential hypertension   Hyperlipidemia   OSA (obstructive sleep apnea)   New onset of congestive heart failure (HCC)  Acute exacerbation of systolic CHF -EF found to be 25-30% -appreciate cardiology's consult-- plan for cath today and possible cardiac MRI in AM -recent flu -?entresto  Acute respiratory distress:  -much improved after 1 dose of lasix  IDT2DM:  - Continue insulin at slightly lower dose and add SSI  Essential hypertension: Elevated in ED. - chage ACE to ARB (?entresto in the future) - IV hydralazine IV prn.  Hyperlipidemia - Continue statin  OSA:  - CPAP qHS - CM consulted for assistance with obtaining CPAP.       DVT prophylaxis:  Lovenox   Code Status: Full Code   Family  Communication:   Disposition Plan:     Consultants:   cardiology   Subjective: Breathing better  Objective: Vitals:   12/19/17 0746 12/19/17 0923 12/19/17 1045 12/19/17 1122  BP: (!) 132/99 (!) 122/98 (!) 131/100 (!) 129/98  Pulse: 92 97 91 92  Resp: 12 16 18    Temp:   98 F (36.7 C)   TempSrc:   Oral   SpO2: 96% 97% 100%   Weight:   95.5 kg (210 lb 9.6 oz)   Height:   5\' 10"  (1.778 m)     Intake/Output Summary (Last 24 hours) at 12/19/2017 1345 Last data filed at 12/19/2017 1100 Gross per 24 hour  Intake 240 ml  Output -  Net 240 ml   Filed Weights   12/18/17 0312 12/19/17 1045  Weight: 102.1 kg (225 lb) 95.5 kg (210 lb 9.6 oz)    Examination:  General exam: Appears calm and comfortable  Respiratory system: Clear to auscultation. Respiratory effort normal. Cardiovascular system: S1 & S2 heard, RRR. No JVD, murmurs, rubs, gallops or clicks. No pedal edema. Gastrointestinal system: Abdomen is nondistended, soft and nontender. No organomegaly or masses felt. Normal bowel sounds heard. Central nervous system: Alert and oriented. No focal neurological deficits. Extremities: Symmetric 5 x 5 power. Skin: No rashes, lesions or ulcers Psychiatry: Judgement and insight appear normal. Mood & affect appropriate.     Data Reviewed: I have personally reviewed following labs and imaging studies  CBC: Recent Labs  Lab 12/18/17 0332  WBC 7.2  NEUTROABS 3.6  HGB 13.6  HCT 41.0  MCV 82.8  PLT 258   Basic Metabolic Panel: Recent Labs  Lab 12/18/17 1752 12/19/17 0342  NA 140 141  K 3.6 3.7  CL 106 107  CO2 25 26  GLUCOSE 87 97  BUN 8 14  CREATININE 0.96 1.05  CALCIUM 8.8* 8.5*   GFR: Estimated Creatinine Clearance: 107.4 mL/min (by C-G formula based on SCr of 1.05 mg/dL). Liver Function Tests: No results for input(s): AST, ALT, ALKPHOS, BILITOT, PROT, ALBUMIN in the last 168 hours. No results for input(s): LIPASE, AMYLASE in the last 168 hours. No results  for input(s): AMMONIA in the last 168 hours. Coagulation Profile: No results for input(s): INR, PROTIME in the last 168 hours. Cardiac Enzymes: No results for input(s): CKTOTAL, CKMB, CKMBINDEX, TROPONINI in the last 168 hours. BNP (last 3 results) No results for input(s): PROBNP in the last 8760 hours. HbA1C: Recent Labs    12/18/17 2030  HGBA1C 6.0*   CBG: Recent Labs  Lab 12/18/17 2206 12/19/17 0556 12/19/17 0755 12/19/17 1207  GLUCAP 107* 88 163* 101*   Lipid Profile: No results for input(s): CHOL, HDL, LDLCALC, TRIG, CHOLHDL, LDLDIRECT in the last 72 hours. Thyroid Function Tests: No results for input(s): TSH, T4TOTAL, FREET4, T3FREE, THYROIDAB in the last 72 hours. Anemia Panel: No results for input(s): VITAMINB12, FOLATE, FERRITIN, TIBC, IRON, RETICCTPCT in the last 72 hours. Urine analysis:    Component Value Date/Time   COLORURINE STRAW 12/13/2015 1537   APPEARANCEUR CLEAR 12/13/2015 1537   LABSPEC 1.005 12/13/2015 1537   PHURINE 5.0 12/13/2015 1537   GLUCOSEU 2+ (A) 12/13/2015 1537   HGBUR TRACE (A) 12/13/2015 1537   BILIRUBINUR NEGATIVE 12/13/2015 1537   KETONESUR 2+ (A) 12/13/2015 1537   PROTEINUR NEGATIVE 12/13/2015 1537   NITRITE NEGATIVE 12/13/2015 1537   LEUKOCYTESUR NEGATIVE 12/13/2015 1537     )No results found for this or any previous visit (from the past 240 hour(s)).    Anti-infectives (From admission, onward)   None       Radiology Studies: Dg Chest 2 View  Result Date: 12/18/2017 CLINICAL DATA:  42 y/o  M; shortness of breath. EXAM: CHEST - 2 VIEW COMPARISON:  11/02/2017 chest radiograph. FINDINGS: Stable mild cardiomegaly. Interval resolution of bilateral upper lobe pneumonia. Increased reticular opacities greatest in the lung bases. No focal consolidation, effusion, or pneumothorax. Bones are unremarkable. IMPRESSION: Mild cardiomegaly. Increase reticular opacities greatest in the lung bases, probably bronchitic changes, possibly  vascular congestion. Electronically Signed   By: Mitzi Hansen M.D.   On: 12/18/2017 04:54        Scheduled Meds: . digoxin  0.125 mg Oral Daily  . enoxaparin (LOVENOX) injection  40 mg Subcutaneous Q24H  . insulin aspart  0-15 Units Subcutaneous TID WC  . insulin aspart  0-5 Units Subcutaneous QHS  . insulin detemir  40 Units Subcutaneous QHS  . losartan  25 mg Oral BID  . metoprolol succinate  25 mg Oral Daily  . simvastatin  10 mg Oral q1800  . sodium chloride flush  3 mL Intravenous Q12H  . spironolactone  12.5 mg Oral Daily   Continuous Infusions:   LOS: 0 days    Time spent: 35 min    Joseph Art, DO [facility] Pager [phone]  If 7PM-7AM, please contact night-coverage [email or web address] Password [email or web address] 12/19/2017, 1:45 PM

## 2017-12-19 NOTE — ED Notes (Signed)
Patient transported to echo ?

## 2017-12-19 NOTE — Discharge Planning (Signed)
[facility] consulted regarding pt needing assistance affording CPAP machine.  Pt has insurance coverage and will not qualify for charity services for DMEs.  Pt will also require a recent sleep study to be eligible for CPAP machine.

## 2017-12-20 ENCOUNTER — Observation Stay (HOSPITAL_COMMUNITY): Payer: BLUE CROSS/BLUE SHIELD

## 2017-12-20 ENCOUNTER — Encounter (HOSPITAL_COMMUNITY): Payer: Self-pay | Admitting: Internal Medicine

## 2017-12-20 DIAGNOSIS — G4733 Obstructive sleep apnea (adult) (pediatric): Secondary | ICD-10-CM

## 2017-12-20 DIAGNOSIS — Z888 Allergy status to other drugs, medicaments and biological substances status: Secondary | ICD-10-CM | POA: Diagnosis not present

## 2017-12-20 DIAGNOSIS — I2510 Atherosclerotic heart disease of native coronary artery without angina pectoris: Secondary | ICD-10-CM | POA: Diagnosis present

## 2017-12-20 DIAGNOSIS — I110 Hypertensive heart disease with heart failure: Secondary | ICD-10-CM | POA: Diagnosis present

## 2017-12-20 DIAGNOSIS — I428 Other cardiomyopathies: Secondary | ICD-10-CM | POA: Diagnosis present

## 2017-12-20 DIAGNOSIS — I5021 Acute systolic (congestive) heart failure: Secondary | ICD-10-CM | POA: Diagnosis present

## 2017-12-20 DIAGNOSIS — I509 Heart failure, unspecified: Secondary | ICD-10-CM | POA: Diagnosis present

## 2017-12-20 DIAGNOSIS — Z794 Long term (current) use of insulin: Secondary | ICD-10-CM | POA: Diagnosis not present

## 2017-12-20 DIAGNOSIS — Z9119 Patient's noncompliance with other medical treatment and regimen: Secondary | ICD-10-CM | POA: Diagnosis not present

## 2017-12-20 DIAGNOSIS — I10 Essential (primary) hypertension: Secondary | ICD-10-CM

## 2017-12-20 DIAGNOSIS — I429 Cardiomyopathy, unspecified: Secondary | ICD-10-CM

## 2017-12-20 DIAGNOSIS — R0603 Acute respiratory distress: Secondary | ICD-10-CM

## 2017-12-20 DIAGNOSIS — E119 Type 2 diabetes mellitus without complications: Secondary | ICD-10-CM

## 2017-12-20 DIAGNOSIS — L500 Allergic urticaria: Secondary | ICD-10-CM | POA: Diagnosis not present

## 2017-12-20 DIAGNOSIS — Z8249 Family history of ischemic heart disease and other diseases of the circulatory system: Secondary | ICD-10-CM | POA: Diagnosis not present

## 2017-12-20 DIAGNOSIS — E785 Hyperlipidemia, unspecified: Secondary | ICD-10-CM | POA: Diagnosis present

## 2017-12-20 DIAGNOSIS — F17210 Nicotine dependence, cigarettes, uncomplicated: Secondary | ICD-10-CM | POA: Diagnosis present

## 2017-12-20 DIAGNOSIS — I447 Left bundle-branch block, unspecified: Secondary | ICD-10-CM | POA: Diagnosis present

## 2017-12-20 DIAGNOSIS — T508X5A Adverse effect of diagnostic agents, initial encounter: Secondary | ICD-10-CM | POA: Diagnosis not present

## 2017-12-20 DIAGNOSIS — Y92238 Other place in hospital as the place of occurrence of the external cause: Secondary | ICD-10-CM | POA: Diagnosis not present

## 2017-12-20 LAB — GLUCOSE, CAPILLARY
GLUCOSE-CAPILLARY: 92 mg/dL (ref 65–99)
Glucose-Capillary: 78 mg/dL (ref 65–99)

## 2017-12-20 LAB — BASIC METABOLIC PANEL
Anion gap: 9 (ref 5–15)
BUN: 11 mg/dL (ref 6–20)
CALCIUM: 9.1 mg/dL (ref 8.9–10.3)
CO2: 27 mmol/L (ref 22–32)
Chloride: 104 mmol/L (ref 101–111)
Creatinine, Ser: 1.12 mg/dL (ref 0.61–1.24)
GFR calc Af Amer: >60 mL/min (ref >60)
Glucose, Bld: 105 mg/dL — ABNORMAL HIGH (ref 65–99)
POTASSIUM: 4.1 mmol/L (ref 3.5–5.1)
Sodium: 140 mmol/L (ref 135–145)

## 2017-12-20 LAB — PROTIME-INR
INR: 1.03
Prothrombin Time: 13.4 seconds (ref 11.4–15.2)

## 2017-12-20 MED ORDER — SPIRONOLACTONE 25 MG PO TABS: 12.5000 mg | ORAL_TABLET | Freq: Every day | ORAL | 0 refills | Status: DC

## 2017-12-20 MED ORDER — ALPRAZOLAM 0.5 MG PO TABS: 0.5000 mg | ORAL_TABLET | Freq: Once | ORAL | Status: DC | PRN

## 2017-12-20 MED ORDER — SACUBITRIL-VALSARTAN 24-26 MG PO TABS: 1.0000 | ORAL_TABLET | Freq: Two times a day (BID) | ORAL | Status: DC

## 2017-12-20 MED ORDER — PREDNISONE 50 MG PO TABS: 50.0000 mg | ORAL_TABLET | Freq: Four times a day (QID) | ORAL | Status: DC

## 2017-12-20 MED ORDER — DIGOXIN 125 MCG PO TABS: 0.1250 mg | ORAL_TABLET | Freq: Every day | ORAL | 3 refills | Status: DC

## 2017-12-20 MED ORDER — DIPHENHYDRAMINE HCL 25 MG PO CAPS: 50.0000 mg | ORAL_CAPSULE | Freq: Once | ORAL | Status: DC

## 2017-12-20 MED ORDER — FUROSEMIDE 40 MG PO TABS: 40.0000 mg | ORAL_TABLET | Freq: Every day | ORAL | Status: DC

## 2017-12-20 MED ORDER — DIAZEPAM 5 MG PO TABS
2.5000 mg | ORAL_TABLET | ORAL | Status: DC | PRN
  Administered 2017-12-20: 2.5 mg via ORAL
  Filled 2017-12-20: qty 1

## 2017-12-20 MED ORDER — DIAZEPAM 5 MG PO TABS: 2.5000 mg | ORAL_TABLET | ORAL | Status: DC | PRN

## 2017-12-20 MED ORDER — DIPHENHYDRAMINE HCL 50 MG/ML IJ SOLN
INTRAMUSCULAR | Status: AC
  Administered 2017-12-20: 50 mg
  Filled 2017-12-20: qty 1

## 2017-12-20 MED ORDER — POTASSIUM CHLORIDE ER 20 MEQ PO TBCR: 20.0000 meq | EXTENDED_RELEASE_TABLET | Freq: Every day | ORAL | 0 refills | Status: AC | PRN

## 2017-12-20 MED ORDER — GADOBENATE DIMEGLUMINE 529 MG/ML IV SOLN
40.0000 mL | Freq: Once | INTRAVENOUS | Status: AC
  Administered 2017-12-20: 38 mL via INTRAVENOUS

## 2017-12-20 MED ORDER — SACUBITRIL-VALSARTAN 24-26 MG PO TABS: 1.0000 | ORAL_TABLET | Freq: Two times a day (BID) | ORAL | 0 refills | Status: DC

## 2017-12-20 MED ORDER — FUROSEMIDE 40 MG PO TABS: 40.0000 mg | ORAL_TABLET | Freq: Every day | ORAL | 3 refills | Status: AC | PRN

## 2017-12-20 NOTE — Discharge Summary (Signed)
Physician Discharge Summary  [name] UJW:[id number] DOB: Sep 04, 1975 DOA: 12/18/2017  PCP: Dorena Bodo, PA-C  Admit date: 12/18/2017 Discharge date: 12/20/2017  Admitted From: Home  Disposition:  Home   Recommendations for Outpatient Follow-up:  1. Follow up with PCP in 1-2 months 2. Please check HgbA1c in next 6 months 3. Follow up with Cardiology HF clinic in 2 weeks 4. Please obtain BMP in two weeks   Home Health: None  Equipment/Devices: None  Discharge Condition: Good  CODE STATUS: FULL Diet recommendation: Cardiac, low sodium, diabetic  Brief/Interim Summary: Mr. Kevin Yates is a 42 y.o. M with IDDM, HTN, OSA not on CPAP who presents with slowly progressive DOE, orthopnea.  Found to have Edema on CXR, elvated BNP, echo with new EF 20-25%.  Started on diuresis.  HF team consulted.      Acute systolic CHF New onset.  Possibly viral mediated.  EF 20-25%.  LHC yesterday showed nonobstructive disease, PCW 23, LVEDP 29, FI low.  Got three doses of Lasix, appears clinically euvolemic.  Cardiac MRI was attempted, but patient had hives to gadolinium, will be repeated as an outpatient.  HF team follow up appointment arranged.   Hypertension Well controlled in hospital.  HF team coordinating medicine titration.  Diabetes Glucoses well controlled here.  Actos discontinued.  OSA Patient needs re-referral for CPAP.        Discharge Diagnoses:  Principal Problem:   Acute respiratory distress Active Problems:   Diabetes mellitus type 2, uncomplicated (HCC)   Essential hypertension   Hyperlipidemia   OSA (obstructive sleep apnea)   New onset of congestive heart failure (HCC)   Acute systolic CHF (congestive heart failure) (HCC)    Discharge Instructions  Discharge Instructions    Avoid straining   Complete by:  As directed    Diet - low sodium heart healthy   Complete by:  As directed    Discharge instructions   Complete by:  As directed    From Dr.  Maryfrances Bunnell: You were admitted for trouble breathing, that turned out to be from heart weakness or "systolic heart failure".  Dr. Clarise Cruz will continue to work with you to determine why this happened.  For now: Take Entresto 24/26 mg twice daily (in the morning and at night) Take digoxin 0.125 mg daily Take metoprolol/Toprol XL 25 mg once daily Take spironolactone 12.5 mg once daily  Fill the furosemide and potassium and keep them at home, but take them only as needed (see below).  Weigh yourself every day, in the morning, with the same scale.  If you notice more than 5 lbs weight gain from your "dry weight" (your weight after leaving the hospital), **take furosemide 40 mg** and call Dr. Kathi Ludwig office at [phone] Furosemide lowers your potassium level, so any time you take it, take the potassium chloride 20 mEq as well.       -This is a blood pressure medicine, and a medicine that allows your heart to remodel  -This is a medicine to slow the heart and prevent straining of the heart, it is also a blood pressure medicine that you were taking before    For your diabetes: Continue your Levemir as you were before Follow up with your primary care doctor in the next 6 months to re-evaluate your diabetes control   Other medication changes: Three medicines that you used to take that you should NOT take anymore -- Do not take lisinopril (this is similar to [facility] and shouldn't be taken  together) Do not take Actos/pioglitazone (this is a diabetes medicine, not good to take for people who have heart failure) Do not take meloxicam (aka "Mobic").  In fact, do not take ANY NSAID pain relievers, which includes ibuprofen, Motrin, Advil, Aleve, naproxen.  You may take Tylenol (I.e. Acetaminophen) but NOT NSAIDs, as they are bad for heart failure.   Heart Failure patients record your daily weight using the same scale at the same time of day   Complete by:  As directed    Increase activity  slowly   Complete by:  As directed    STOP any activity that causes chest pain, shortness of breath, dizziness, sweating, or exessive weakness   Complete by:  As directed      Allergies as of 12/20/2017      Reactions   Metformin And Related Diarrhea   After one dose, developed abdominal pain and diarrhea---12/14/2015   Multihance [gadobenate] Itching, Nausea And Vomiting, Rash   Itching of hands and feet. Rash all over body. Swelling of lips. Nasal congestion.      Medication List    STOP taking these medications   acetaZOLAMIDE 500 MG capsule Commonly known as:  DIAMOX   doxycycline 100 MG capsule Commonly known as:  VIBRAMYCIN   Guaifenesin 1200 MG Tb12   lisinopril 10 MG tablet Commonly known as:  PRINIVIL,ZESTRIL   meloxicam 7.5 MG tablet Commonly known as:  MOBIC   pioglitazone 45 MG tablet Commonly known as:  ACTOS     TAKE these medications   cyclobenzaprine 10 MG tablet Commonly known as:  FLEXERIL Take 1 tablet (10 mg total) by mouth 3 (three) times daily as needed for muscle spasms.   digoxin 0.125 MG tablet Commonly known as:  LANOXIN Take 1 tablet (0.125 mg total) by mouth daily. Start taking on:  12/21/2017   furosemide 40 MG tablet Commonly known as:  LASIX Take 1 tablet (40 mg total) by mouth daily as needed for fluid or edema (Or for weight gain more than 5 lbs from baseline).   Insulin Detemir 100 UNIT/ML Pen Commonly known as:  LEVEMIR FLEXTOUCH Inject 50 Units daily at 10 pm into the skin.   metoprolol succinate 25 MG 24 hr tablet Commonly known as:  TOPROL-XL TAKE 1 TABLET BY MOUTH EVERY DAY   Potassium Chloride ER 20 MEQ Tbcr Take 20 mEq by mouth daily as needed (If taking furosemide/Lasix).   sacubitril-valsartan 24-26 MG Commonly known as:  ENTRESTO Take 1 tablet by mouth 2 (two) times daily. Start taking on:  12/21/2017   simvastatin 10 MG tablet Commonly known as:  ZOCOR TAKE 1 TABLET BY MOUTH EVERYDAY AT BEDTIME    spironolactone 25 MG tablet Commonly known as:  ALDACTONE Take 0.5 tablets (12.5 mg total) by mouth daily. Start taking on:  12/21/2017      Follow-up Information    [facility] Follow up on 01/01/2018.   Specialty:  Cardiology Why:  at 1130 am for post hospital follow up. The code for parking is 1200. Psychologist, sport and exercise thru [employer] representative off of [address]. Underground parking on your right. Can also park in lower ED lot and enter thru blue awning.  Contact information: [address] [id number] mc [place] [address] [phone]         Allergies  Allergen Reactions  . Metformin And Related Diarrhea    After one dose, developed abdominal pain and diarrhea---12/14/2015  . Multihance [Gadobenate] Itching, Nausea And Vomiting  and Rash    Itching of hands and feet. Rash all over body. Swelling of lips. Nasal congestion.    Consultations:  Cardiology   Procedures/Studies: Dg Chest 2 View  Result Date: 12/18/2017 CLINICAL DATA:  42 y/o  M; shortness of breath. EXAM: CHEST - 2 VIEW COMPARISON:  11/02/2017 chest radiograph. FINDINGS: Stable mild cardiomegaly. Interval resolution of bilateral upper lobe pneumonia. Increased reticular opacities greatest in the lung bases. No focal consolidation, effusion, or pneumothorax. Bones are unremarkable. IMPRESSION: Mild cardiomegaly. Increase reticular opacities greatest in the lung bases, probably bronchitic changes, possibly vascular congestion. Electronically Signed   By: Mitzi Hansen M.D.   On: 12/18/2017 04:54   Mr Cardiac Morphology W Wo Contrast  Result Date: 12/20/2017 CLINICAL DATA:  Cardiomyopathy of uncertain etiology. EXAM: CARDIAC MRI TECHNIQUE: The patient was scanned on a 1.5 Tesla GE magnet. A dedicated cardiac coil was used. Functional imaging was done using Fiesta sequences. 2,3, and 4 chamber views were done to assess for RWMA's. Modified Simpson's rule using a short  axis stack was used to calculate an ejection fraction on a dedicated work [employer] officer, [employer]. The patient received 38 cc of Multihance. He was unable to do delayed enhancement images due to an allergic reaction to the contrast. The study was terminated. CONTRAST:  38 cc Multihance. FINDINGS: Limited images of the lung fields showed no acute abnormalities. Mildly dilated left ventricle with normal wall thickness. Septal-lateral dyssynchrony with diffuse hypokinesis, EF 16%. Normal right ventricular size with mildly decreased systolic function. Mild left atrial enlargement. Normal right atrial size. Trileaflet aortic valve without significant stenosis or regurgitation. There did not appear to be significant mitral regurgitation. Unable to obtain delayed enhancement images due to a contrast reaction. Study was discontinued. Measurements: LVEDV 267 mL LVSV 43 mL LVEF 16% IMPRESSION: 1. Mildly dilated LV with diffuse hypokinesis, septal-lateral dyssynchrony. EF 16%. 2.  Normal RV size with mildly decreased systolic function. 3. Delayed enhancement images not obtained due to contrast reaction, study was stopped. Dalton Mclean Electronically Signed   By: Marca Ancona M.D.   On: 12/20/2017 14:53   Echocardiogram 5/8 Study Conclusions  - Left ventricle: The cavity size was moderately dilated. Wall   thickness was increased in a pattern of moderate LVH. Systolic   function was severely reduced. The estimated ejection fraction   was in the range of 25% to 30%. Diffuse hypokinesis. The study is   not technically sufficient to allow evaluation of LV diastolic   function. - Mitral valve: Mildly thickened leaflets . There was mild   regurgitation. - Left atrium: The atrium was mildly dilated. - Right ventricle: The cavity size was mildly dilated. Systolic   function was low normal. - Inferior vena cava: The vessel was normal in size. The   respirophasic diameter changes were in the normal range (=  50%),   consistent with normal central venous pressure.  Impressions:  - LVEF 25-30%, moderately dilated LV with moderate LVH, global   hypokinesis, mild MR, mild LAE, mild RVE with low normal RV   systolic function, normal IVC.    Left and right heart cath 5/8  Mid RCA lesion is 60% stenosed.  Prox Cx lesion is 40% stenosed.  Prox LAD lesion is 40% stenosed.   Findings:  Ao = 121/89 (104) LV =113/29 RA = 6 RV = 40/8 PA = 39/20 (29) PCW = 23 Fick cardiac output/index = 4.8/2.3 PVR = 1.2 WU SVR = 1630 Ao sat = 97%  PA sat = 64%, 66%  Assessment: 1. Non-obstructive CAD 2. 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 Moderately elevated filling pressures with mildly decreased CO  Plan/Discussion:  Suspect HTN cardiomyopathy. Will need further diuresis. Continue titration of HF meds. Risk factor reduction. Add statin.   Arvilla Meres, MD  3:27 PM    Subjective: Feels well.  Orthopnea less, dyspnea less.  Ambulated with nursing and felt well.  No chest pain, no dizziness.  Discharge Exam: Vitals:   12/20/17 1104 12/20/17 1207  BP: (!) 131/95 121/90  Pulse: 95 90  Resp: 16 20  Temp:  98.4 F (36.9 C)  SpO2: 97% 95%   Vitals:   12/20/17 0629 12/20/17 0854 12/20/17 1104 12/20/17 1207  BP: (!) 133/92 121/85 (!) 131/95 121/90  Pulse: 90 86 95 90  Resp: 18 18 16 20   Temp:  98.5 F (36.9 C)  98.4 F (36.9 C)  TempSrc:  Oral  Oral  SpO2: 95% 98% 97% 95%  Weight:      Height:        General appearance: Adult male, alert and in no acute distress.  Conversational. HEENT: Anicteric, conjunctiva pink, lids and lashes normal. No nasal deformity, discharge, epistaxis.  Lips moist, teeth normal, OP moist, no oral lesions.   Skin: Warm and dry. No suspicious rashes or lesions. Cardiac: Tachycardic, regular, normal S1-S2, no murmurs appreciated.  Capillary refill is brisk.  JVP normal.  No LE edema.  Radial pulses 2+ and symmetric. Respiratory: Normal respiratory rate  and rhythm.  CTAB without rales or wheezes. Abdomen: Abdomen soft.  No TTP or guarding. No ascites, distension, hepatosplenomegaly.   MSK: No deformities or effusions. Neuro: Awake and alert.  EOMI, moves all extremities. Speech fluent.    Psych: Sensorium intact and responding to questions, attention normal. Affect normal.  Judgment and insight appear normal.      The results of significant diagnostics from this hospitalization (including imaging, microbiology, ancillary and laboratory) are listed below for reference.     Microbiology: No results found for this or any previous visit (from the past 240 hour(s)).   Labs: BNP (last 3 results) Recent Labs    07/17/17 1244 12/18/17 0332  BNP 275.2* 265.9*   Basic Metabolic Panel: Recent Labs  Lab 12/18/17 1752 12/19/17 0342 12/19/17 1617 12/20/17 0621  NA 140 141  --  140  K 3.6 3.7  --  4.1  CL 106 107  --  104  CO2 25 26  --  27  GLUCOSE 87 97  --  105*  BUN 8 14  --  11  CREATININE 0.96 1.05 1.13 1.12  CALCIUM 8.8* 8.5*  --  9.1   Liver Function Tests: No results for input(s): AST, ALT, ALKPHOS, BILITOT, PROT, ALBUMIN in the last 168 hours. No results for input(s): LIPASE, AMYLASE in the last 168 hours. No results for input(s): AMMONIA in the last 168 hours. CBC: Recent Labs  Lab 12/18/17 0332 12/19/17 1617  WBC 7.2 5.7  NEUTROABS 3.6  --   HGB 13.6 13.3  HCT 41.0 41.4  MCV 82.8 82.8  PLT 258 246   Cardiac Enzymes: No results for input(s): CKTOTAL, CKMB, CKMBINDEX, TROPONINI in the last 168 hours. BNP: Invalid input(s): POCBNP CBG: Recent Labs  Lab 12/19/17 1207 12/19/17 1620 12/19/17 2129 12/20/17 0750 12/20/17 1150  GLUCAP 101* 117* 85 92 78   D-Dimer Recent Labs    12/18/17 1323  DDIMER 0.28   Hgb A1c Recent Labs  12/18/17 2030  HGBA1C 6.0*   Lipid Profile No results for input(s): CHOL, HDL, LDLCALC, TRIG, CHOLHDL, LDLDIRECT in the last 72 hours. Thyroid function studies No  results for input(s): TSH, T4TOTAL, T3FREE, THYROIDAB in the last 72 hours.  Invalid input(s): FREET3 Anemia work up No results for input(s): VITAMINB12, FOLATE, FERRITIN, TIBC, IRON, RETICCTPCT in the last 72 hours. Urinalysis    Component Value Date/Time   COLORURINE STRAW 12/13/2015 1537   APPEARANCEUR CLEAR 12/13/2015 1537   LABSPEC 1.005 12/13/2015 1537   PHURINE 5.0 12/13/2015 1537   GLUCOSEU 2+ (A) 12/13/2015 1537   HGBUR TRACE (A) 12/13/2015 1537   BILIRUBINUR NEGATIVE 12/13/2015 1537   KETONESUR 2+ (A) 12/13/2015 1537   PROTEINUR NEGATIVE 12/13/2015 1537   NITRITE NEGATIVE 12/13/2015 1537   LEUKOCYTESUR NEGATIVE 12/13/2015 1537   Sepsis Labs Invalid input(s): PROCALCITONIN,  WBC,  LACTICIDVEN Microbiology No results found for this or any previous visit (from the past 240 hour(s)).   Time coordinating discharge: 40 minutes  SIGNED:   Alberteen Sam, MD  [facility] 12/20/2017, 3:56 PM

## 2017-12-20 NOTE — Progress Notes (Signed)
Patient ID: Kevin Yates, male   DOB: March 13, 1976, 42 y.o.   MRN: [id number]   Pt in MRI for Cardiac MRI  Gadolinium 38 cc IV Pt developed small palpable hives over shoulders; chest; legs Itching of skin MR Tech reports pts lips seem puffy as compared to before procedure Denies SOB Talking in complete sentences  Chest: CTA No wheezes  Skin with small palpable hive like lesions Mouth is not swollen Swallowing without difficulty  Dr Miles Costain has also examined pt Ordered Benadryl 50 mg IV now Back to room  Will recheck in am

## 2017-12-20 NOTE — Progress Notes (Signed)
Patient set up on CPAP 10.5 CMH20 tolerating well no issues to report.

## 2017-12-20 NOTE — Progress Notes (Deleted)
Likely allergy to Gadolinium noted.   Discussed with Radiology. Will pre-treat with Prednisone and Benadryl, and re-try MRI tomorrow am.   Will likely be stable for discharge after.   Discussed with patient who agrees to proceed.   Casimiro Needle [address] [place], PA-C 12/20/2017 2:30 PM

## 2017-12-20 NOTE — Progress Notes (Addendum)
Benefit check in progress for Entresto. Abelino Derrick [facility] [phone]  4:13 pm - Entresto coupon card given to pt with explanation of usage; Alexis Goodell [phone]   RE: Benefit check  Received: Today  Message Contents  Mardene Sayer CMA        # 3. S/W [facility] @ [employer] [employer] [facility] # [phone]    ENTRESTO 24/26 MG BID  COVER- YES  CO-PAY- $ 80.00  TIER- 4 DRUG  PRIOR APPROVAL- YES # [phone] OPT- 4, OPT- 1   PREFERRED PHARMACY : YES- [facility]

## 2017-12-20 NOTE — Progress Notes (Addendum)
PROGRESS NOTE    Kevin Yates  MCN:[id number] DOB: [birth year] DOA: 12/18/2017 PCP: Dorena Bodo, PA-C      Brief Narrative:  [name] is a 42 y.o. M with IDDM, HTN, OSA not on CPAP who presents with slowly progressive DOE, orthopnea.  Found to have Edema on CXR, elvated BNP, echo with new EF 20-25%.  Started on diuresis.  HF team consulted.   Assessment & Plan:  Acute systolic CHF New onset.  EF 20-25%.  LHC yesterday showed nonobstructive disease, PCW 23, LVEDP 29, FI low.  Got third dose of Lasix this AM, appears clinically euvolemic. -Continue digoxin, ARB, spiro -Defer titration of BB to Cardiology -Defer further furosemide to Cardiology  Hypertension -Continue ARB, spironolactone -Continue statin  Diabetes Glucoses well controlled here -Continue Levemir, SSI  OSA -CPAP at night   DVT prophylaxis: Lovenox Code Status: FULL Family Communication: None present MDM and disposition Plan: The below labs and imaging reports were reviewed and summarized above.  He was admitted with new onset congestive heart failure, severe, now improving.  Net negative 1.4L, symptoms improved.  Defer further diuresis to Cardiology, failed cardiac MRI today.      Consultants:   Heart failure  Procedures:   Echocardiogram 5/8  LHC 5/8  Mid RCA lesion is 60% stenosed.  Prox Cx lesion is 40% stenosed.  Prox LAD lesion is 40% stenosed.   Findings:  Ao = 121/89 (104) LV =113/29 RA = 6 RV = 40/8 PA = 39/20 (29) PCW = 23 Fick cardiac output/index = 4.8/2.3 PVR = 1.2 WU SVR = 1630 Ao sat = 97% PA sat = 64%, 66%  Antimicrobials:   None    Subjective: Feels well.  Orthopnea still present, mild.  Dyspnea on exertion improved, but still present.  No cough, sputum, fever.  No chest pain.  Objective: Vitals:   12/20/17 0629 12/20/17 0854 12/20/17 1104 12/20/17 1207  BP: (!) 133/92 121/85 (!) 131/95 121/90  Pulse: 90 86 95 90  Resp: 18 18 16 20   Temp:  98.5 F  (36.9 C)  98.4 F (36.9 C)  TempSrc:  Oral  Oral  SpO2: 95% 98% 97% 95%  Weight:      Height:        Intake/Output Summary (Last 24 hours) at 12/20/2017 1314 Last data filed at 12/20/2017 1016 Gross per 24 hour  Intake 750 ml  Output 2450 ml  Net -1700 ml   Filed Weights   12/18/17 0312 12/19/17 1045 12/20/17 0625  Weight: 102.1 kg (225 lb) 95.5 kg (210 lb 9.6 oz) 94.4 kg (208 lb 3.2 oz)    Examination: General appearance: Adult male, alert and in no acute distress.  Conversational. HEENT: Anicteric, conjunctiva pink, lids and lashes normal. No nasal deformity, discharge, epistaxis.  Lips moist, teeth normal, OP moist, no oral lesions.   Skin: Warm and dry. No suspicious rashes or lesions. Cardiac: Tachycardic, regular, normal S1-S2, no murmurs appreciated.  Capillary refill is brisk.  JVP normal.  No LE edema.  Radial pulses 2+ and symmetric. Respiratory: Normal respiratory rate and rhythm.  CTAB without rales or wheezes. Abdomen: Abdomen soft.  No TTP or guarding. No ascites, distension, hepatosplenomegaly.   MSK: No deformities or effusions. Neuro: Awake and alert.  EOMI, moves all extremities. Speech fluent.    Psych: Sensorium intact and responding to questions, attention normal. Affect normal.  Judgment and insight appear normal.    Data Reviewed: I have personally reviewed following labs and imaging studies:  CBC: Recent Labs  Lab 12/18/17 0332 12/19/17 1617  WBC 7.2 5.7  NEUTROABS 3.6  --   HGB 13.6 13.3  HCT 41.0 41.4  MCV 82.8 82.8  PLT 258 246   Basic Metabolic Panel: Recent Labs  Lab 12/18/17 1752 12/19/17 0342 12/19/17 1617 12/20/17 0621  NA 140 141  --  140  K 3.6 3.7  --  4.1  CL 106 107  --  104  CO2 25 26  --  27  GLUCOSE 87 97  --  105*  BUN 8 14  --  11  CREATININE 0.96 1.05 1.13 1.12  CALCIUM 8.8* 8.5*  --  9.1   GFR: Estimated Creatinine Clearance: 100.2 mL/min (by C-G formula based on SCr of 1.12 mg/dL). Liver Function Tests: No  results for input(s): AST, ALT, ALKPHOS, BILITOT, PROT, ALBUMIN in the last 168 hours. No results for input(s): LIPASE, AMYLASE in the last 168 hours. No results for input(s): AMMONIA in the last 168 hours. Coagulation Profile: Recent Labs  Lab 12/20/17 0621  INR 1.03   Cardiac Enzymes: No results for input(s): CKTOTAL, CKMB, CKMBINDEX, TROPONINI in the last 168 hours. BNP (last 3 results) No results for input(s): PROBNP in the last 8760 hours. HbA1C: Recent Labs    12/18/17 2030  HGBA1C 6.0*   CBG: Recent Labs  Lab 12/19/17 1207 12/19/17 1620 12/19/17 2129 12/20/17 0750 12/20/17 1150  GLUCAP 101* 117* 85 92 78   Lipid Profile: No results for input(s): CHOL, HDL, LDLCALC, TRIG, CHOLHDL, LDLDIRECT in the last 72 hours. Thyroid Function Tests: No results for input(s): TSH, T4TOTAL, FREET4, T3FREE, THYROIDAB in the last 72 hours. Anemia Panel: No results for input(s): VITAMINB12, FOLATE, FERRITIN, TIBC, IRON, RETICCTPCT in the last 72 hours. Urine analysis:    Component Value Date/Time   COLORURINE STRAW 12/13/2015 1537   APPEARANCEUR CLEAR 12/13/2015 1537   LABSPEC 1.005 12/13/2015 1537   PHURINE 5.0 12/13/2015 1537   GLUCOSEU 2+ (A) 12/13/2015 1537   HGBUR TRACE (A) 12/13/2015 1537   BILIRUBINUR NEGATIVE 12/13/2015 1537   KETONESUR 2+ (A) 12/13/2015 1537   PROTEINUR NEGATIVE 12/13/2015 1537   NITRITE NEGATIVE 12/13/2015 1537   LEUKOCYTESUR NEGATIVE 12/13/2015 1537   Sepsis Labs: @LABRCNTIP (procalcitonin:4,lacticacidven:4)  )No results found for this or any previous visit (from the past 240 hour(s)).       Radiology Studies: No results found.      Scheduled Meds: . digoxin  0.125 mg Oral Daily  . enoxaparin (LOVENOX) injection  40 mg Subcutaneous Q24H  . insulin aspart  0-15 Units Subcutaneous TID WC  . insulin aspart  0-5 Units Subcutaneous QHS  . insulin detemir  40 Units Subcutaneous QHS  . losartan  25 mg Oral BID  . metoprolol succinate  25  mg Oral Daily  . simvastatin  10 mg Oral q1800  . sodium chloride flush  3 mL Intravenous Q12H  . sodium chloride flush  3 mL Intravenous Q12H  . spironolactone  12.5 mg Oral Daily   Continuous Infusions: . sodium chloride       LOS: 0 days    Time spent: 25 minutes    Alberteen Sam, MD [facility] 12/20/2017, 12:01 PM     Pager [phone] --- please page though AMION:  [email or web address] If 7PM-7AM, please contact night-coverage

## 2017-12-20 NOTE — Progress Notes (Addendum)
Advanced Heart Failure Rounding Note  PCP-Cardiologist: No primary care provider on file.   Subjective:    Modest UOP with 40 mg IV lasix (-1.1 L). Weight down 2 lbs. Creatinine stable 1.12  Denies CP or SOB Says he feels better. No orthopnea or PND.   Echo 12/19/17 LVEF 25-30%, Diffuse HK, Mild MR, Mild LAE, Mild RV dilation, IVC WNL.  (Dr Gala Romney reviewed and EF ~20%)   RHC 12/19/17: RA mean 6 RV 40/8 PA 39/20 PCWP 23 AO 121/89 Cardiac Output (Fick) 4.8 Cardiac Index (Fick) 2.3  LHC 12/19/17:  Mid RCA lesion is 60% stenosed.  Prox Cx lesion is 40% stenosed.  Prox LAD lesion is 40% stenosed.  Assessment: 1. Non-obstructive CAD 2. 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 Moderately elevated filling pressures with mildly decreased CO   Objective:   Weight Range: 208 lb 3.2 oz (94.4 kg) Body mass index is 29.87 kg/m.   Vital Signs:   Temp:  [97.7 F (36.5 C)-98.2 F (36.8 C)] 97.7 F (36.5 C) (05/08 2330) Pulse Rate:  [0-109] 90 (05/09 0629) Resp:  [0-27] 18 (05/09 0629) BP: (119-151)/(83-108) 133/92 (05/09 0629) SpO2:  [0 %-100 %] 95 % (05/09 0629) Weight:  [208 lb 3.2 oz (94.4 kg)-210 lb 9.6 oz (95.5 kg)] 208 lb 3.2 oz (94.4 kg) (05/09 0625) Last BM Date: 12/19/17  Weight change: Filed Weights   12/18/17 0312 12/19/17 1045 12/20/17 0625  Weight: 225 lb (102.1 kg) 210 lb 9.6 oz (95.5 kg) 208 lb 3.2 oz (94.4 kg)    Intake/Output:   Intake/Output Summary (Last 24 hours) at 12/20/2017 0746 Last data filed at 12/20/2017 0625 Gross per 24 hour  Intake 750 ml  Output 1850 ml  Net -1100 ml      Physical Exam    General:  Well appearing. No resp difficulty HEENT: Normal Neck: Supple. JVP . Carotids 2+ bilat; no bruits. No lymphadenopathy or thyromegaly appreciated. Cor: PMI laerally displaced. Regular rate & rhythm. No rubs, gallops or murmurs. Lungs: Clear Abdomen: Soft, nontender, nondistended. No hepatosplenomegaly. No bruits or masses. Good bowel  sounds. Extremities: No cyanosis, clubbing, rash, edema Neuro: Alert & orientedx3, cranial nerves grossly intact. moves all 4 extremities w/o difficulty. Affect pleasant   Telemetry   NSR 90s. Personally reviewed   EKG    N/a   Labs    CBC Recent Labs    12/18/17 0332 12/19/17 1617  WBC 7.2 5.7  NEUTROABS 3.6  --   HGB 13.6 13.3  HCT 41.0 41.4  MCV 82.8 82.8  PLT 258 246   Basic Metabolic Panel Recent Labs    08/22/30 0342 12/19/17 1617 12/20/17 0621  NA 141  --  140  K 3.7  --  4.1  CL 107  --  104  CO2 26  --  27  GLUCOSE 97  --  105*  BUN 14  --  11  CREATININE 1.05 1.13 1.12  CALCIUM 8.5*  --  9.1   Liver Function Tests No results for input(s): AST, ALT, ALKPHOS, BILITOT, PROT, ALBUMIN in the last 72 hours. No results for input(s): LIPASE, AMYLASE in the last 72 hours. Cardiac Enzymes No results for input(s): CKTOTAL, CKMB, CKMBINDEX, TROPONINI in the last 72 hours.  BNP: BNP (last 3 results) Recent Labs    07/17/17 1244 12/18/17 0332  BNP 275.2* 265.9*    ProBNP (last 3 results) No results for input(s): PROBNP in the last 8760 hours.   D-Dimer Recent Labs  12/18/17 1323  DDIMER 0.28   Hemoglobin A1C Recent Labs    12/18/17 2030  HGBA1C 6.0*   Fasting Lipid Panel No results for input(s): CHOL, HDL, LDLCALC, TRIG, CHOLHDL, LDLDIRECT in the last 72 hours. Thyroid Function Tests No results for input(s): TSH, T4TOTAL, T3FREE, THYROIDAB in the last 72 hours.  Invalid input(s): FREET3  Other results:   Imaging     No results found.   Medications:     Scheduled Medications: . digoxin  0.125 mg Oral Daily  . enoxaparin (LOVENOX) injection  40 mg Subcutaneous Q24H  . furosemide  40 mg Intravenous BID  . insulin aspart  0-15 Units Subcutaneous TID WC  . insulin aspart  0-5 Units Subcutaneous QHS  . insulin detemir  40 Units Subcutaneous QHS  . losartan  25 mg Oral BID  . metoprolol succinate  25 mg Oral Daily  .  simvastatin  10 mg Oral q1800  . sodium chloride flush  3 mL Intravenous Q12H  . sodium chloride flush  3 mL Intravenous Q12H  . spironolactone  12.5 mg Oral Daily     Infusions: . sodium chloride       PRN Medications:  sodium chloride, acetaminophen, hydrALAZINE, ondansetron (ZOFRAN) IV, ondansetron **OR** [DISCONTINUED] ondansetron (ZOFRAN) IV, sodium chloride flush    Patient Profile   Kevin Yates is a 42 y.o. male with HTN, IDT2DM, HLD, OSA not on CPAP, Tobacco abuse, and Substance abuse.   Admitted with 3-5 days of new onset dyspnea. Echo with new systolic CHF  Assessment/Plan   1. Acute systolic CHF - Echo 12/19/17 LVEF 25-30%, Diffuse HK, Mild MR, Mild LAE, Mild RV dilation, IVC WNL.  - EF may even be as low as 20% with global HK  - NICM. R/LHC 12/19/17 with nonobstructive CAD, severe NICM with EF 20%, moderately elevated filling pressures with mildly decreased CO. Cardiac MRI today. DDx includes but not limited to HTN, OSA, LBBB, or Viral CMP. - NYHA III symptoms - Volume status looks better today . - Continue losartan 25 mg BID in anticipation of Entresto as outpatient .  - Continue Toprol 25 mg daily for now.  - Continue digoxin 0.125 mg daily. - Continue spiro 12.5 mg daily.  - Can consider Bidil down the road. - Will get cMRI today. PRN valium order placed.   2. HTN - Will adjust meds in setting of treating his CHF. SBP 120-130s  3. OSA  - + Sleep Study 2018 - Non-compliant with CPAP due to cost.  - Suspect large contributor to his CMP.   4. HLD - Will need PCP  5. DM2 - Well controlled. Hgb A1c this admit 6.0. - Per primary.   6. Tobacco abuse - Encouraged complete cessation.   7. Nonobstructive CAD - Continue statin  Will titrate HF medications as tolerated. Will need repeat Echo in 3-4 months for ICD consideration.   Medication concerns reviewed with patient and pharmacy team. Barriers identified: Compliance and substance abuse.     Length of Stay: 0  Alford Highland, NP  12/20/2017, 7:46 AM  Advanced Heart Failure Team Pager [phone] (M-F; 7a - 4p)  Please contact [facility] for night-coverage after hours (4p -7a ) and weekends on [email or web address]  Patient seen and examined with the above-signed Advanced Practice Provider and/or Housestaff. I personally reviewed laboratory data, imaging studies and relevant notes. I independently examined the patient and formulated the important aspects of the plan. I have edited the note to reflect any of  my changes or salient points. I have personally discussed the plan with the patient and/or family.  Doing well today. Cath results reviewed with him.   Unable to finish cMRI today due to contrast allergy. Cannot receive contrast again for 48 more allergies.   Can d/c home today on following regimen. F/u in HF Clinic for med titration and cMRI with pre-treatment for contrast allergy.   - Switch losartan to Entresto 24/26 bid - Continue Toprol 25 mg daily for now.  - Continue digoxin 0.125 mg daily. - Continue spiro 12.5 mg daily.  - Lasix 40 daily as needed for weight gain  - Kcl 20 daily as need with lasix  Arvilla Meres, MD  3:05 PM

## 2018-01-01 ENCOUNTER — Ambulatory Visit (HOSPITAL_COMMUNITY)
Admit: 2018-01-01 | Discharge: 2018-01-01 | Disposition: A | Discharge status: Home / Self Care | Payer: BLUE CROSS/BLUE SHIELD | Attending: Cardiology | Admitting: Cardiology

## 2018-01-01 ENCOUNTER — Encounter (HOSPITAL_COMMUNITY): Payer: Self-pay | Admitting: Cardiology

## 2018-01-01 VITALS — BP 160/96 | HR 88 | Wt 215.0 lb

## 2018-01-01 DIAGNOSIS — F1010 Alcohol abuse, uncomplicated: Secondary | ICD-10-CM | POA: Diagnosis not present

## 2018-01-01 DIAGNOSIS — F17210 Nicotine dependence, cigarettes, uncomplicated: Secondary | ICD-10-CM | POA: Diagnosis not present

## 2018-01-01 DIAGNOSIS — I429 Cardiomyopathy, unspecified: Secondary | ICD-10-CM | POA: Insufficient documentation

## 2018-01-01 DIAGNOSIS — Z79899 Other long term (current) drug therapy: Secondary | ICD-10-CM | POA: Diagnosis not present

## 2018-01-01 DIAGNOSIS — I10 Essential (primary) hypertension: Secondary | ICD-10-CM

## 2018-01-01 DIAGNOSIS — E782 Mixed hyperlipidemia: Secondary | ICD-10-CM | POA: Diagnosis not present

## 2018-01-01 DIAGNOSIS — Z794 Long term (current) use of insulin: Secondary | ICD-10-CM | POA: Insufficient documentation

## 2018-01-01 DIAGNOSIS — I5022 Chronic systolic (congestive) heart failure: Secondary | ICD-10-CM | POA: Insufficient documentation

## 2018-01-01 DIAGNOSIS — I2510 Atherosclerotic heart disease of native coronary artery without angina pectoris: Secondary | ICD-10-CM | POA: Diagnosis not present

## 2018-01-01 DIAGNOSIS — G4733 Obstructive sleep apnea (adult) (pediatric): Secondary | ICD-10-CM | POA: Diagnosis not present

## 2018-01-01 DIAGNOSIS — I110 Hypertensive heart disease with heart failure: Secondary | ICD-10-CM | POA: Insufficient documentation

## 2018-01-01 DIAGNOSIS — F17200 Nicotine dependence, unspecified, uncomplicated: Secondary | ICD-10-CM

## 2018-01-01 DIAGNOSIS — E119 Type 2 diabetes mellitus without complications: Secondary | ICD-10-CM | POA: Insufficient documentation

## 2018-01-01 DIAGNOSIS — E785 Hyperlipidemia, unspecified: Secondary | ICD-10-CM | POA: Insufficient documentation

## 2018-01-01 DIAGNOSIS — Z8249 Family history of ischemic heart disease and other diseases of the circulatory system: Secondary | ICD-10-CM | POA: Diagnosis not present

## 2018-01-01 LAB — BASIC METABOLIC PANEL
Anion gap: 6 (ref 5–15)
BUN: 14 mg/dL (ref 6–20)
CO2: 26 mmol/L (ref 22–32)
CREATININE: 1.1 mg/dL (ref 0.61–1.24)
Calcium: 9.1 mg/dL (ref 8.9–10.3)
Chloride: 106 mmol/L (ref 101–111)
Glucose, Bld: 98 mg/dL (ref 65–99)
POTASSIUM: 4.6 mmol/L (ref 3.5–5.1)
Sodium: 138 mmol/L (ref 135–145)

## 2018-01-01 MED ORDER — SPIRONOLACTONE 25 MG PO TABS: 25.0000 mg | ORAL_TABLET | Freq: Every day | ORAL | 3 refills | Status: DC

## 2018-01-01 NOTE — Patient Instructions (Signed)
INCREASE Spironolactone to 25 mg, one tab daily   Labs today We will only contact you if something comes back abnormal or we need to make some changes. Otherwise no news is good news!   Your physician recommends that you schedule a follow-up appointment in: 2 weeks with Amy Clegg,NP  Do the following things EVERYDAY: 1) Weigh yourself in the morning before breakfast. Write it down and keep it in a log. 2) Take your medicines as prescribed 3) Eat low salt foods-Limit salt (sodium) to 2000 mg per day.  4) Stay as active as you can everyday 5) Limit all fluids for the day to less than 2 liters

## 2018-01-01 NOTE — Progress Notes (Signed)
GNF:AOZH Dixon PA-C Primary Cardiologist: Dr Gala Romney   HPI: Kevin Yates is a 42 y.o. male with HTN, IDT2DM, HLD, OSA not on CPAP, Tobacco abuse, and substance abuse.   Previously admitted 10/2017 for multifocal PNA. Had been treated for the flu earlier in the month. Completed course of ABX and discharged home.  Admitted 12/18/2017 with increased dyspnea and volume overload. Diuresed with a few doses of IV lasix. CMRI attempted however he developed hives on shoulders, chest, and legs after receiving 38 cc gadolinium. Also had some lip edema so CMRI was aborted. He received benadryl with resolution. He was discharged on 12/20/2017.   Today he presents for post hospital follow up. Wants to go back to work. Overall feeling fine. Denies SOB/PND/Orthopnea. No chest pain. Appetite ok. No fever or chills. Weight at home 210-211 pounds.  Taking all medications. Drinks 1 pint of liqour on the weekends and smokes 2-3 cigarettes.   SH: Works in a [employer]. Lives with his wife and 2 children. Drinks 1 pint of liquor every weekend. Smokes 2-3 cigarettes a week.   Echo 12/19/17 LVEF 25-30%, Diffuse HK, Mild MR, Mild LAE, Mild RV dilation, IVC WNL.  (I viewed personally and EF ~20%)   CMRI 12/20/2017 - aborted. Had 38 cc IV gadolinium and developed hives on his legs, chest , shoulders. Also had lip edema. Received benadryl.   RHC 12/19/17: RA mean 6 RV 40/8 PA 39/20 PCWP 23 AO 121/89 Cardiac Output (Fick) 4.8 Cardiac Index (Fick) 2.3  LHC 12/19/17:  Mid RCA lesion is 60% stenosed.  Prox Cx lesion is 40% stenosed.  Prox LAD lesion is 40% stenosed. Assessment: 1. Non-obstructive CAD 2. 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 Moderately elevated filling pressures with mildly decreased CO  ROS: All systems negative except as listed in HPI, PMH and Problem List.  SH:  Social History   Socioeconomic History  . Marital status: Married    Spouse name: Not on file  . Number of children: 1  . Years of  education: 32  . Highest education level: Not on file  Occupational History  . Not on file  Social Needs  . Financial resource strain: Not on file  . Food insecurity:    Worry: Not on file    Inability: Not on file  . Transportation needs:    Medical: Not on file    Non-medical: Not on file  Tobacco Use  . Smoking status: Current Some Day Smoker    Packs/day: 0.50    Types: Cigarettes    Last attempt to quit: 05/14/2017    Years since quitting: 0.6  . Smokeless tobacco: Never Used  Substance and Sexual Activity  . Alcohol use: Yes    Alcohol/week: 4.8 oz    Types: 8 Standard drinks or equivalent per week    Comment: mostly on weekend  . Drug use: No  . Sexual activity: Yes    Partners: Female  Lifestyle  . Physical activity:    Days per week: Not on file    Minutes per session: Not on file  . Stress: Not on file  Relationships  . Social connections:    Talks on phone: Not on file    Gets together: Not on file    Attends religious service: Not on file    Active member of club or organization: Not on file    Attends meetings of clubs or organizations: Not on file    Relationship status: Not on file  .  Intimate partner violence:    Fear of current or ex partner: Not on file    Emotionally abused: Not on file    Physically abused: Not on file    Forced sexual activity: Not on file  Other Topics Concern  . Not on file  Social History Narrative   Lives with wife and daughter in a 2 story home.  Has one daughter.  Works on used cars.  Education: college.    FH:  Family History  Problem Relation Age of Onset  . Hypertension Mother   . Hypertension Father     Past Medical History:  Diagnosis Date  . Diabetes mellitus without complication (HCC)   . Hypertension     Current Outpatient Medications  Medication Sig Dispense Refill  . cyclobenzaprine (FLEXERIL) 10 MG tablet Take 1 tablet (10 mg total) by mouth 3 (three) times daily as needed for muscle spasms. 30  tablet 0  . digoxin (LANOXIN) 0.125 MG tablet Take 1 tablet (0.125 mg total) by mouth daily. 30 tablet 3  . furosemide (LASIX) 40 MG tablet Take 1 tablet (40 mg total) by mouth daily as needed for fluid or edema (Or for weight gain more than 5 lbs from baseline). 30 tablet 3  . Insulin Detemir (LEVEMIR FLEXTOUCH) 100 UNIT/ML Pen Inject 50 Units daily at 10 pm into the skin. 5 pen 5  . metoprolol succinate (TOPROL-XL) 25 MG 24 hr tablet TAKE 1 TABLET BY MOUTH EVERY DAY 30 tablet 2  . potassium chloride 20 MEQ TBCR Take 20 mEq by mouth daily as needed (If taking furosemide/Lasix). 30 tablet 0  . sacubitril-valsartan (ENTRESTO) 24-26 MG Take 1 tablet by mouth 2 (two) times daily. 60 tablet 0  . simvastatin (ZOCOR) 10 MG tablet TAKE 1 TABLET BY MOUTH EVERYDAY AT BEDTIME 30 tablet 0  . spironolactone (ALDACTONE) 25 MG tablet Take 0.5 tablets (12.5 mg total) by mouth daily. 30 tablet 0   No current facility-administered medications for this encounter.     Vitals:   01/01/18 1048  BP: (!) 160/96  Pulse: 88  SpO2: 100%  Weight: 215 lb (97.5 kg)    PHYSICAL EXAM:  General:  Well appearing. No resp difficulty HEENT: normal Neck: supple. JVP flat. Carotids 2+ bilaterally; no bruits. No lymphadenopathy or thryomegaly appreciated. Cor: PMI normal. Regular rate & rhythm. No rubs, gallops or murmurs. Lungs: clear Abdomen: soft, nontender, nondistended. No hepatosplenomegaly. No bruits or masses. Good bowel sounds. Extremities: no cyanosis, clubbing, rash, edema Neuro: alert & orientedx3, cranial nerves grossly intact. Moves all 4 extremities w/o difficulty. Affect pleasant.    ASSESSMENT & PLAN: 1. Chronic Systolic Heart Failure Echo5/8/19LVEF 25-30%, Diffuse HK, Mild MR, Mild LAE, Mild RV dilation, IVC WNL.  - EF may even be as low as 20% with global HK  - NICM. R/LHC 12/19/17 with nonobstructive CAD, severe NICM with EF 20%, moderately elevated filling pressures with mildly decreased CO.  -  CMRI aborted on 5/9 due to allergic reaction. I will follow up with Radiology to see if we can premedicate. For now hold off on cMRI -NYHA I-II. NICM. Volume status stable. Discussed taking lasix as needed for 3 pound weight gain.  Continue toprol xl 25 mg daily. Continue digoxin 0.125 mg daily.  Continue entresto 24-26 mg twice a day.   Increase spiro to 25 mg daily.  Check BMET tody  2. HTN Continue current medications.  Elevated. Increase spiro today.  3. OSA Has CPAP but currently not working.  He plans to follow up with pulmonary.  4. HLD Continue statin  5. DMII Recent Hgb A1C 6 Per PCP 6. Smoker Discussed smoking cessation  7. CAD, nonobstructive No s/s ischemia  Continue statin 8. Alcohol abuse Discussed cutting back on alcohol intake.   Today he was provided a note for work. Discussed low salt diet, limiting fluid intake, medication changes and the timing of repeat ECHO. Discussed smoking cessation and limiting alcohol intake.   Follow up in 2 weeks. Check BMET and dig level at that time.   Helmuth Recupero NP-C  11:46 AM

## 2018-01-01 NOTE — Progress Notes (Signed)
Return to work letter

## 2018-01-14 NOTE — Progress Notes (Signed)
ZOX:WRUE Dixon PA-C Primary Cardiologist: Dr Gala Romney   HPI: Kevin Yates is a 42 y.o. male with HTN, IDT2DM, HLD, OSA not on CPAP, Tobacco abuse, and substance abuse.   Previously admitted 10/2017 for multifocal PNA. Had been treated for the flu earlier in the month. Completed course of ABX and discharged home.  Admitted 12/18/2017 with increased dyspnea and volume overload. Diuresed with a few doses of IV lasix. CMRI attempted however he developed hives on shoulders, chest, and legs after receiving 38 cc gadolinium. Also had some lip edema so CMRI was aborted. He received benadryl with resolution. He was discharged on 12/20/2017.   Today he returns for HF follow up. Last week spiro was increased to 25 mg daily. Overall feeling fine. Denies SOB/PND/Orthopnea. Appetite ok. No fever or chills. Weight at home 212-215  Pounds. He has not needed lasix. Walking ~5000 steps a day. Taking all medications. Smoking a few cigarettes on the weekend. Working full time.   SH: Works in a [employer]. Lives with his wife and 2 children. Drinks 1 pint of liquor every weekend. Smokes 2-3 cigarettes a week.   Echo 12/19/17 LVEF 25-30%, Diffuse HK, Mild MR, Mild LAE, Mild RV dilation, IVC WNL.  (I viewed personally and EF ~20%)   CMRI 12/20/2017 - aborted. Had 38 cc IV gadolinium and developed hives on his legs, chest , shoulders. Also had lip edema. Received benadryl.   RHC 12/19/17: RA mean 6 RV 40/8 PA 39/20 PCWP 23 AO 121/89 Cardiac Output (Fick) 4.8 Cardiac Index (Fick) 2.3  LHC 12/19/17:  Mid RCA lesion is 60% stenosed.  Prox Cx lesion is 40% stenosed.  Prox LAD lesion is 40% stenosed. Assessment: 1. Non-obstructive CAD 2. 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 Moderately elevated filling pressures with mildly decreased CO  ROS: All systems negative except as listed in HPI, PMH and Problem List.  SH:  Social History   Socioeconomic History  . Marital status: Married    Spouse name: Not on file  .  Number of children: 1  . Years of education: 46  . Highest education level: Not on file  Occupational History  . Not on file  Social Needs  . Financial resource strain: Not on file  . Food insecurity:    Worry: Not on file    Inability: Not on file  . Transportation needs:    Medical: Not on file    Non-medical: Not on file  Tobacco Use  . Smoking status: Current Some Day Smoker    Packs/day: 0.50    Types: Cigarettes    Last attempt to quit: 05/14/2017    Years since quitting: 0.6  . Smokeless tobacco: Never Used  Substance and Sexual Activity  . Alcohol use: Yes    Alcohol/week: 4.8 oz    Types: 8 Standard drinks or equivalent per week    Comment: mostly on weekend  . Drug use: No  . Sexual activity: Yes    Partners: Female  Lifestyle  . Physical activity:    Days per week: Not on file    Minutes per session: Not on file  . Stress: Not on file  Relationships  . Social connections:    Talks on phone: Not on file    Gets together: Not on file    Attends religious service: Not on file    Active member of club or organization: Not on file    Attends meetings of clubs or organizations: Not on file  Relationship status: Not on file  . Intimate partner violence:    Fear of current or ex partner: Not on file    Emotionally abused: Not on file    Physically abused: Not on file    Forced sexual activity: Not on file  Other Topics Concern  . Not on file  Social History Narrative   Lives with wife and daughter in a 2 story home.  Has one daughter.  Works on used cars.  Education: college.    FH:  Family History  Problem Relation Age of Onset  . Hypertension Mother   . Hypertension Father     Past Medical History:  Diagnosis Date  . Diabetes mellitus without complication (HCC)   . Hypertension     Current Outpatient Medications  Medication Sig Dispense Refill  . digoxin (LANOXIN) 0.125 MG tablet Take 1 tablet (0.125 mg total) by mouth daily. 30 tablet 3  .  furosemide (LASIX) 40 MG tablet Take 1 tablet (40 mg total) by mouth daily as needed for fluid or edema (Or for weight gain more than 5 lbs from baseline). 30 tablet 3  . metoprolol succinate (TOPROL-XL) 25 MG 24 hr tablet TAKE 1 TABLET BY MOUTH EVERY DAY 30 tablet 2  . potassium chloride 20 MEQ TBCR Take 20 mEq by mouth daily as needed (If taking furosemide/Lasix). 30 tablet 0  . sacubitril-valsartan (ENTRESTO) 24-26 MG Take 1 tablet by mouth 2 (two) times daily. 60 tablet 0  . simvastatin (ZOCOR) 10 MG tablet TAKE 1 TABLET BY MOUTH EVERYDAY AT BEDTIME 30 tablet 0  . spironolactone (ALDACTONE) 25 MG tablet Take 1 tablet (25 mg total) by mouth daily. 30 tablet 3  . cyclobenzaprine (FLEXERIL) 10 MG tablet Take 1 tablet (10 mg total) by mouth 3 (three) times daily as needed for muscle spasms. 30 tablet 0  . Insulin Detemir (LEVEMIR FLEXTOUCH) 100 UNIT/ML Pen Inject 50 Units daily at 10 pm into the skin. 5 pen 5   No current facility-administered medications for this encounter.     Vitals:   01/15/18 0824  BP: 132/80  Pulse: 90  SpO2: 99%  Weight: 217 lb 6.4 oz (98.6 kg)   Wt Readings from Last 3 Encounters:  01/15/18 217 lb 6.4 oz (98.6 kg)  01/01/18 215 lb (97.5 kg)  12/20/17 208 lb 3.2 oz (94.4 kg)    PHYSICAL EXAM: General:  Well appearing. No resp difficulty HEENT: normal Neck: supple. no JVD. Carotids 2+ bilat; no bruits. No lymphadenopathy or thryomegaly appreciated. Cor: PMI nondisplaced. Regular rate & rhythm. No rubs, gallops or murmurs. Lungs: clear Abdomen: soft, nontender, nondistended. No hepatosplenomegaly. No bruits or masses. Good bowel sounds. Extremities: no cyanosis, clubbing, rash, LLE dark black area on posterior aspect. No edema . L ankle tender.  Neuro: alert & orientedx3, cranial nerves grossly intact. moves all 4 extremities w/o difficulty. Affect pleasant    ASSESSMENT & PLAN: 1. Chronic Systolic Heart Failure Echo5/8/19LVEF 25-30%, Diffuse HK, Mild  MR, Mild LAE, Mild RV dilation, IVC WNL.  - EF may even be as low as 20% with global HK  - NICM. R/LHC 12/19/17 with nonobstructive CAD, severe NICM with EF 20%, moderately elevated filling pressures with mildly decreased CO.  - CMRI aborted on 5/9 due to allergic reaction. For now hold off on cMRI -NICM.Marland Kitchen Plan to repeat ECHO after HF meds optimized--> August 2019  NYHA I. Volume status stable. Continue lasix as needed.  Continue toprol xl 25 mg daily. Continue digoxin 0.125  mg daily.  -Increase entresto to 49-51 mg twice a day. .   - Continue spiro to 25 mg daily.  -Check BMET and dig level.   2. HTN Increase entresto as above.  3. OSA Has CPAP but currently not working.  He plans to follow up with pulmonary.  4. HLD Continue statin  5. DMII Recent Hgb A1C 6 Per PCP 6. Smoker Discussed smoking cessation.  7. CAD, nonobstructive No S/S ischemia  Continue statin 8. Alcohol abuse Discussed cutting back on alcohol intake.  9. L ankle pain.  Check uric acid level.  Check BMET, dig level, and uric acid today. Increase entresto as above. Check BMET in 7 days. Plan to repeat ECHO in August.   Follow up in 4 weeks anticipate increasing bb.   Quetzally Callas NP-C  8:29 AM

## 2018-01-15 ENCOUNTER — Encounter (HOSPITAL_COMMUNITY): Payer: Self-pay | Admitting: Cardiology

## 2018-01-15 ENCOUNTER — Ambulatory Visit (HOSPITAL_COMMUNITY)
Admission: RE | Admit: 2018-01-15 | Discharge: 2018-01-15 | Disposition: A | Discharge status: Home / Self Care | Payer: BLUE CROSS/BLUE SHIELD | Source: Ambulatory Visit | Attending: Cardiology | Admitting: Cardiology

## 2018-01-15 ENCOUNTER — Telehealth (HOSPITAL_COMMUNITY): Payer: Self-pay | Admitting: *Deleted

## 2018-01-15 ENCOUNTER — Encounter (HOSPITAL_COMMUNITY): Payer: Self-pay

## 2018-01-15 VITALS — BP 132/80 | HR 90 | Wt 217.4 lb

## 2018-01-15 DIAGNOSIS — E119 Type 2 diabetes mellitus without complications: Secondary | ICD-10-CM | POA: Diagnosis not present

## 2018-01-15 DIAGNOSIS — E782 Mixed hyperlipidemia: Secondary | ICD-10-CM | POA: Diagnosis not present

## 2018-01-15 DIAGNOSIS — I5022 Chronic systolic (congestive) heart failure: Secondary | ICD-10-CM | POA: Diagnosis not present

## 2018-01-15 DIAGNOSIS — Z8249 Family history of ischemic heart disease and other diseases of the circulatory system: Secondary | ICD-10-CM | POA: Diagnosis not present

## 2018-01-15 DIAGNOSIS — Z79899 Other long term (current) drug therapy: Secondary | ICD-10-CM | POA: Insufficient documentation

## 2018-01-15 DIAGNOSIS — Z794 Long term (current) use of insulin: Secondary | ICD-10-CM | POA: Insufficient documentation

## 2018-01-15 DIAGNOSIS — F17200 Nicotine dependence, unspecified, uncomplicated: Secondary | ICD-10-CM | POA: Diagnosis not present

## 2018-01-15 DIAGNOSIS — F17210 Nicotine dependence, cigarettes, uncomplicated: Secondary | ICD-10-CM | POA: Diagnosis not present

## 2018-01-15 DIAGNOSIS — F1010 Alcohol abuse, uncomplicated: Secondary | ICD-10-CM | POA: Diagnosis not present

## 2018-01-15 DIAGNOSIS — E785 Hyperlipidemia, unspecified: Secondary | ICD-10-CM | POA: Diagnosis not present

## 2018-01-15 DIAGNOSIS — I428 Other cardiomyopathies: Secondary | ICD-10-CM | POA: Insufficient documentation

## 2018-01-15 DIAGNOSIS — I2510 Atherosclerotic heart disease of native coronary artery without angina pectoris: Secondary | ICD-10-CM | POA: Insufficient documentation

## 2018-01-15 DIAGNOSIS — G4733 Obstructive sleep apnea (adult) (pediatric): Secondary | ICD-10-CM | POA: Diagnosis not present

## 2018-01-15 DIAGNOSIS — M25572 Pain in left ankle and joints of left foot: Secondary | ICD-10-CM | POA: Diagnosis not present

## 2018-01-15 DIAGNOSIS — I10 Essential (primary) hypertension: Secondary | ICD-10-CM | POA: Diagnosis present

## 2018-01-15 DIAGNOSIS — I110 Hypertensive heart disease with heart failure: Secondary | ICD-10-CM | POA: Insufficient documentation

## 2018-01-15 LAB — BASIC METABOLIC PANEL
Anion gap: 7 (ref 5–15)
BUN: 11 mg/dL (ref 6–20)
CALCIUM: 9 mg/dL (ref 8.9–10.3)
CHLORIDE: 108 mmol/L (ref 101–111)
CO2: 27 mmol/L (ref 22–32)
CREATININE: 1.19 mg/dL (ref 0.61–1.24)
GFR calc non Af Amer: >60 mL/min (ref >60)
GLUCOSE: 133 mg/dL — AB (ref 65–99)
Potassium: 3.9 mmol/L (ref 3.5–5.1)
Sodium: 142 mmol/L (ref 135–145)

## 2018-01-15 LAB — URIC ACID: Uric Acid, Serum: 8.9 mg/dL — ABNORMAL HIGH (ref 4.4–7.6)

## 2018-01-15 LAB — DIGOXIN LEVEL: Digoxin Level: 0.5 ng/mL — ABNORMAL LOW (ref 0.8–2.0)

## 2018-01-15 MED ORDER — SACUBITRIL-VALSARTAN 49-51 MG PO TABS: 1.0000 | ORAL_TABLET | Freq: Two times a day (BID) | ORAL | 6 refills | Status: DC

## 2018-01-15 NOTE — Telephone Encounter (Signed)
Entresto 49-51 PA approved through [facility] from 01/15/18 through 08/13/38.

## 2018-01-15 NOTE — Patient Instructions (Signed)
INCREASE Entresto to 4951 mg, one tab twice a day  Labs today We will only contact you if something comes back abnormal or we need to make some changes. Otherwise no news is good news!   Labs needed in 7-10 days  Your physician recommends that you schedule a follow-up appointment in: 4 weeks with Amy Clegg,NP   Do the following things EVERYDAY: 1) Weigh yourself in the morning before breakfast. Write it down and keep it in a log. 2) Take your medicines as prescribed 3) Eat low salt foods-Limit salt (sodium) to 2000 mg per day.  4) Stay as active as you can everyday 5) Limit all fluids for the day to less than 2 liters

## 2018-01-15 NOTE — Progress Notes (Signed)
Work letter

## 2018-01-19 ENCOUNTER — Other Ambulatory Visit: Payer: Self-pay | Admitting: Physician Assistant

## 2018-01-21 NOTE — Telephone Encounter (Signed)
Patient due for office visit letter mailed to call and schedule an appointment.

## 2018-01-25 ENCOUNTER — Ambulatory Visit (HOSPITAL_COMMUNITY)
Admission: RE | Admit: 2018-01-25 | Discharge: 2018-01-25 | Disposition: A | Discharge status: Home / Self Care | Payer: BLUE CROSS/BLUE SHIELD | Source: Ambulatory Visit | Attending: Cardiology | Admitting: Cardiology

## 2018-01-25 DIAGNOSIS — I5022 Chronic systolic (congestive) heart failure: Secondary | ICD-10-CM | POA: Insufficient documentation

## 2018-01-25 LAB — BASIC METABOLIC PANEL
ANION GAP: 6 (ref 5–15)
BUN: 11 mg/dL (ref 6–20)
CALCIUM: 8.8 mg/dL — AB (ref 8.9–10.3)
CO2: 28 mmol/L (ref 22–32)
Chloride: 105 mmol/L (ref 101–111)
Creatinine, Ser: 1.11 mg/dL (ref 0.61–1.24)
GFR calc non Af Amer: >60 mL/min (ref >60)
Glucose, Bld: 189 mg/dL — ABNORMAL HIGH (ref 65–99)
Potassium: 3.9 mmol/L (ref 3.5–5.1)
SODIUM: 139 mmol/L (ref 135–145)

## 2018-02-19 NOTE — Progress Notes (Signed)
Advanced Heart Failure Clinic Note   IOM:BTDH Dixon PA-C Primary Cardiologist: Dr Gala Romney   HPI: Kevin Yates is a 42 y.o. male  with HTN, IDT2DM, HLD, OSA not on CPAP, Tobacco abuse, and substance abuse.   Previously admitted 10/2017 for multifocal PNA. Had been treated for the flu earlier in the month. Completed course of ABX and discharged home.  Admitted 12/18/2017 with increased dyspnea and volume overload. Diuresed with a few doses of IV lasix. CMRI attempted however he developed hives on shoulders, chest, and legs after receiving 38 cc gadolinium. Also had some lip edema so CMRI was aborted. He received benadryl with resolution. He was discharged on 12/20/2017.   He presents today for follow up. Last visit Entresto increased. He is feeling great. Walking > 5000 steps a day. 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 He denies DOE/PND/Orthopnea. Appetite stable. No fevers or chills. He has not needed any prn diuretics. Taking all medications as directed. Trying to stop smoking completely.   SH: Works in a [employer]. Lives with his wife and 2 children. Drinks 1 pint of liquor every weekend. Smokes 2-3 cigarettes a week.   Echo 12/19/17 LVEF 25-30%, Diffuse HK, Mild MR, Mild LAE, Mild RV dilation, IVC WNL.  (I viewed personally and EF ~20%)   CMRI 12/20/2017 - aborted. Had 38 cc IV gadolinium and developed hives on his legs, chest , shoulders. Also had lip edema. Received benadryl.   RHC 12/19/17: RA mean 6 RV 40/8 PA 39/20 PCWP 23 AO 121/89 Cardiac Output (Fick) 4.8 Cardiac Index (Fick) 2.3  LHC 12/19/17:  Mid RCA lesion is 60% stenosed.  Prox Cx lesion is 40% stenosed.  Prox LAD lesion is 40% stenosed. Assessment: 1. Non-obstructive CAD 2. 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 Moderately elevated filling pressures with mildly decreased CO  Review of systems complete and found to be negative unless listed in HPI.    SH:  Social History    Socioeconomic History  . Marital status: Married    Spouse name: Not on file  . Number of children: 1  . Years of education: 7  . Highest education level: Not on file  Occupational History  . Not on file  Social Needs  . Financial resource strain: Not on file  . Food insecurity:    Worry: Not on file    Inability: Not on file  . Transportation needs:    Medical: Not on file    Non-medical: Not on file  Tobacco Use  . Smoking status: Current Some Day Smoker    Packs/day: 0.50    Types: Cigarettes    Last attempt to quit: 05/14/2017    Years since quitting: 0.7  . Smokeless tobacco: Never Used  Substance and Sexual Activity  . Alcohol use: Yes    Alcohol/week: 4.8 oz    Types: 8 Standard drinks or equivalent per week    Comment: mostly on weekend  . Drug use: No  . Sexual activity: Yes    Partners: Female  Lifestyle  . Physical activity:    Days per week: Not on file    Minutes per session: Not on file  . Stress: Not on file  Relationships  . Social connections:    Talks on phone: Not on file    Gets together: Not on file    Attends religious service: Not on file    Active member of club or organization: Not on file  Attends meetings of clubs or organizations: Not on file    Relationship status: Not on file  . Intimate partner violence:    Fear of current or ex partner: Not on file    Emotionally abused: Not on file    Physically abused: Not on file    Forced sexual activity: Not on file  Other Topics Concern  . Not on file  Social History Narrative   Lives with wife and daughter in a 2 story home.  Has one daughter.  Works on used cars.  Education: college.    FH:  Family History  Problem Relation Age of Onset  . Hypertension Mother   . Hypertension Father     Past Medical History:  Diagnosis Date  . Diabetes mellitus without complication (HCC)   . Hypertension     Current Outpatient Medications  Medication Sig Dispense Refill  .  cyclobenzaprine (FLEXERIL) 10 MG tablet Take 1 tablet (10 mg total) by mouth 3 (three) times daily as needed for muscle spasms. 30 tablet 0  . digoxin (LANOXIN) 0.125 MG tablet Take 1 tablet (0.125 mg total) by mouth daily. 30 tablet 3  . furosemide (LASIX) 40 MG tablet Take 1 tablet (40 mg total) by mouth daily as needed for fluid or edema (Or for weight gain more than 5 lbs from baseline). 30 tablet 3  . Insulin Detemir (LEVEMIR FLEXTOUCH) 100 UNIT/ML Pen Inject 50 Units daily at 10 pm into the skin. 5 pen 5  . metoprolol succinate (TOPROL-XL) 25 MG 24 hr tablet TAKE 1 TABLET BY MOUTH EVERY DAY 30 tablet 2  . potassium chloride 20 MEQ TBCR Take 20 mEq by mouth daily as needed (If taking furosemide/Lasix). 30 tablet 0  . sacubitril-valsartan (ENTRESTO) 49-51 MG Take 1 tablet by mouth 2 (two) times daily. 60 tablet 6  . simvastatin (ZOCOR) 10 MG tablet TAKE 1 TABLET BY MOUTH EVERY EVENING AT BEDTIME 30 tablet 0  . spironolactone (ALDACTONE) 25 MG tablet Take 1 tablet (25 mg total) by mouth daily. 30 tablet 3   No current facility-administered medications for this encounter.    Vitals:   02/22/18 1036  BP: 120/78  Pulse: 90  SpO2: 99%  Weight: 217 lb (98.4 kg)    Wt Readings from Last 3 Encounters:  02/22/18 217 lb (98.4 kg)  01/15/18 217 lb 6.4 oz (98.6 kg)  01/01/18 215 lb (97.5 kg)   PHYSICAL EXAM: General: Well appearing. No resp difficulty. HEENT: Normal Neck: Supple. JVP 5-6. Carotids 2+ bilat; no bruits. No thyromegaly or nodule noted. Cor: PMI nondisplaced. RRR, No M/G/R noted Lungs: CTAB, normal effort. Abdomen: Soft, non-tender, non-distended, no HSM. No bruits or masses. +BS  Extremities: No cyanosis, clubbing, or rash. R and LLE no edema.  Neuro: Alert & orientedx3, cranial nerves grossly intact. moves all 4 extremities w/o difficulty. Affect pleasant   ASSESSMENT & PLAN: 1. Chronic Systolic Heart Failure - Echo5/8/19LVEF 25-30%, Diffuse HK, Mild MR, Mild LAE, Mild  RV dilation, IVC WNL.  - EF may even be as low as 20% with global HK  - NICM. R/LHC 12/19/17 with nonobstructive CAD, severe NICM with EF 20%, moderately elevated filling pressures with mildly decreased CO.  - CMRI aborted on 5/9 due to allergic reaction. For now hold off on cMRI - NICM. Plan to repeat ECHO next month with HF optimization.  - NYHA I-II symptoms - Volume status stable on exam.   - Continue lasix as needed.  - Continue toprol xl 25  mg daily.  - Continue digoxin 0.125 mg daily.  - Increase Entresto to 97/103 goal/max dose. BMET today and 10-14 days.  - Continue spiro to 25 mg daily.  - Reinforced fluid restriction to < 2 L daily, sodium restriction to less than 2000 mg daily, and the importance of daily weights.    2. HTN - Entresto as above.  3. OSA - CPAP no functioning.  - Encouraged pulmonary follow up.  4. HLD - Continue statin.  5. DMII - Hgb A1C 6 12/18/17 - Per PCP 6. Smoker - Encouraged smoking cessation.  7. CAD, nonobstructive by cath 12/19/17 as above.  - No s/s of ischemia.    - Continue statin 8. Alcohol abuse - Encouraged moderation -> cessation.  9. L ankle pain.  - Resolved. Per PCP.   Repeat Echo next month (August). RTC 6-8 with Echo. Meds and labs as above.    Graciella Freer, PA-C  10:44 AM   Greater than 50% of the 25 minute visit was spent in counseling/coordination of care regarding disease state education, salt/fluid restriction, sliding scale diuretics, and medication compliance.

## 2018-02-22 ENCOUNTER — Ambulatory Visit (HOSPITAL_COMMUNITY)
Admission: RE | Admit: 2018-02-22 | Discharge: 2018-02-22 | Disposition: A | Discharge status: Home / Self Care | Payer: BLUE CROSS/BLUE SHIELD | Source: Ambulatory Visit | Attending: Cardiology | Admitting: Cardiology

## 2018-02-22 ENCOUNTER — Encounter (HOSPITAL_COMMUNITY): Payer: Self-pay

## 2018-02-22 VITALS — BP 120/78 | HR 90 | Wt 217.0 lb

## 2018-02-22 DIAGNOSIS — M25572 Pain in left ankle and joints of left foot: Secondary | ICD-10-CM | POA: Insufficient documentation

## 2018-02-22 DIAGNOSIS — F17200 Nicotine dependence, unspecified, uncomplicated: Secondary | ICD-10-CM

## 2018-02-22 DIAGNOSIS — I429 Cardiomyopathy, unspecified: Secondary | ICD-10-CM | POA: Insufficient documentation

## 2018-02-22 DIAGNOSIS — E782 Mixed hyperlipidemia: Secondary | ICD-10-CM

## 2018-02-22 DIAGNOSIS — Z79899 Other long term (current) drug therapy: Secondary | ICD-10-CM | POA: Diagnosis not present

## 2018-02-22 DIAGNOSIS — E785 Hyperlipidemia, unspecified: Secondary | ICD-10-CM | POA: Diagnosis not present

## 2018-02-22 DIAGNOSIS — I2510 Atherosclerotic heart disease of native coronary artery without angina pectoris: Secondary | ICD-10-CM | POA: Insufficient documentation

## 2018-02-22 DIAGNOSIS — I5022 Chronic systolic (congestive) heart failure: Secondary | ICD-10-CM | POA: Insufficient documentation

## 2018-02-22 DIAGNOSIS — E119 Type 2 diabetes mellitus without complications: Secondary | ICD-10-CM | POA: Diagnosis not present

## 2018-02-22 DIAGNOSIS — F1010 Alcohol abuse, uncomplicated: Secondary | ICD-10-CM | POA: Insufficient documentation

## 2018-02-22 DIAGNOSIS — G4733 Obstructive sleep apnea (adult) (pediatric): Secondary | ICD-10-CM | POA: Diagnosis not present

## 2018-02-22 DIAGNOSIS — I10 Essential (primary) hypertension: Secondary | ICD-10-CM

## 2018-02-22 DIAGNOSIS — F17210 Nicotine dependence, cigarettes, uncomplicated: Secondary | ICD-10-CM | POA: Insufficient documentation

## 2018-02-22 DIAGNOSIS — Z794 Long term (current) use of insulin: Secondary | ICD-10-CM | POA: Insufficient documentation

## 2018-02-22 DIAGNOSIS — I110 Hypertensive heart disease with heart failure: Secondary | ICD-10-CM | POA: Diagnosis not present

## 2018-02-22 DIAGNOSIS — Z8249 Family history of ischemic heart disease and other diseases of the circulatory system: Secondary | ICD-10-CM | POA: Insufficient documentation

## 2018-02-22 LAB — BASIC METABOLIC PANEL
ANION GAP: 10 (ref 5–15)
BUN: 12 mg/dL (ref 6–20)
CO2: 20 mmol/L — ABNORMAL LOW (ref 22–32)
Calcium: 9.2 mg/dL (ref 8.9–10.3)
Chloride: 110 mmol/L (ref 98–111)
Creatinine, Ser: 1.04 mg/dL (ref 0.61–1.24)
Glucose, Bld: 99 mg/dL (ref 70–99)
POTASSIUM: 4.2 mmol/L (ref 3.5–5.1)
SODIUM: 140 mmol/L (ref 135–145)

## 2018-02-22 NOTE — Patient Instructions (Signed)
Routine lab work today. Will notify you of abnormal results, otherwise no news is good news!  Return for repeat labs in 1-2 weeks.  Follow up 6-8 weeks with Dr Gala Romney and echocardiogram.  Take all medication as prescribed the day of your appointment. Bring all medications with you to your appointment.  Do the following things EVERYDAY: 1) Weigh yourself in the morning before breakfast. Write it down and keep it in a log. 2) Take your medicines as prescribed 3) Eat low salt foods-Limit salt (sodium) to 2000 mg per day.  4) Stay as active as you can everyday 5) Limit all fluids for the day to less than 2 liters

## 2018-03-04 ENCOUNTER — Other Ambulatory Visit (HOSPITAL_COMMUNITY): Payer: BLUE CROSS/BLUE SHIELD

## 2018-03-08 ENCOUNTER — Ambulatory Visit (HOSPITAL_COMMUNITY)
Admission: RE | Admit: 2018-03-08 | Discharge: 2018-03-08 | Disposition: A | Discharge status: Home / Self Care | Payer: BLUE CROSS/BLUE SHIELD | Source: Ambulatory Visit | Attending: Cardiology | Admitting: Cardiology

## 2018-03-08 ENCOUNTER — Encounter (HOSPITAL_COMMUNITY): Payer: Self-pay

## 2018-03-08 DIAGNOSIS — I5022 Chronic systolic (congestive) heart failure: Secondary | ICD-10-CM | POA: Diagnosis not present

## 2018-03-08 LAB — BASIC METABOLIC PANEL
Anion gap: 8 (ref 5–15)
BUN: 12 mg/dL (ref 6–20)
CALCIUM: 8.9 mg/dL (ref 8.9–10.3)
CO2: 24 mmol/L (ref 22–32)
CREATININE: 1.32 mg/dL — AB (ref 0.61–1.24)
Chloride: 108 mmol/L (ref 98–111)
GFR calc Af Amer: >60 mL/min (ref >60)
GFR calc non Af Amer: >60 mL/min (ref >60)
GLUCOSE: 121 mg/dL — AB (ref 70–99)
Potassium: 4.1 mmol/L (ref 3.5–5.1)
Sodium: 140 mmol/L (ref 135–145)

## 2018-03-10 ENCOUNTER — Other Ambulatory Visit (HOSPITAL_COMMUNITY): Payer: Self-pay | Admitting: Adult Health

## 2018-03-10 ENCOUNTER — Other Ambulatory Visit: Payer: Self-pay | Admitting: Physician Assistant

## 2018-03-11 ENCOUNTER — Telehealth (HOSPITAL_COMMUNITY): Payer: Self-pay | Admitting: Cardiology

## 2018-03-11 DIAGNOSIS — I5022 Chronic systolic (congestive) heart failure: Secondary | ICD-10-CM

## 2018-03-11 NOTE — Telephone Encounter (Signed)
Notes recorded by Theresia Bough, CMA on 03/11/2018 at 4:13 PM EDT Patient aware. Patient voiced understanding, repeat labs 8/8  ------  Notes recorded by Theresia Bough, CMA on 03/08/2018 at 4:56 PM EDT Left message for patient to call back.   [phone] (M) ------  Notes recorded by Graciella Freer, PA-C on 03/08/2018 at 10:08 AM EDT Relatively stable. Follow. Can repeat BMET 2 weeks to make sure stable.     Casimiro Needle [address]" [place], PA-C 03/08/2018 10:07 AM

## 2018-03-21 ENCOUNTER — Other Ambulatory Visit (HOSPITAL_COMMUNITY): Payer: Self-pay | Admitting: *Deleted

## 2018-03-21 ENCOUNTER — Ambulatory Visit (HOSPITAL_COMMUNITY)
Admission: RE | Admit: 2018-03-21 | Discharge: 2018-03-21 | Disposition: A | Discharge status: Home / Self Care | Payer: BLUE CROSS/BLUE SHIELD | Source: Ambulatory Visit | Attending: Cardiology | Admitting: Cardiology

## 2018-03-21 DIAGNOSIS — I5022 Chronic systolic (congestive) heart failure: Secondary | ICD-10-CM | POA: Insufficient documentation

## 2018-03-21 LAB — BASIC METABOLIC PANEL
Anion gap: 8 (ref 5–15)
BUN: 14 mg/dL (ref 6–20)
CHLORIDE: 110 mmol/L (ref 98–111)
CO2: 24 mmol/L (ref 22–32)
CREATININE: 1.34 mg/dL — AB (ref 0.61–1.24)
Calcium: 9 mg/dL (ref 8.9–10.3)
GFR calc Af Amer: >60 mL/min (ref >60)
GFR calc non Af Amer: >60 mL/min (ref >60)
Glucose, Bld: 107 mg/dL — ABNORMAL HIGH (ref 70–99)
POTASSIUM: 4.1 mmol/L (ref 3.5–5.1)
Sodium: 142 mmol/L (ref 135–145)

## 2018-04-11 ENCOUNTER — Other Ambulatory Visit (HOSPITAL_COMMUNITY): Payer: Self-pay | Admitting: Pharmacist

## 2018-04-11 MED ORDER — SPIRONOLACTONE 25 MG PO TABS: 25.0000 mg | ORAL_TABLET | Freq: Every day | ORAL | 5 refills | Status: DC

## 2018-04-11 MED ORDER — DIGOXIN 125 MCG PO TABS: 0.1250 mg | ORAL_TABLET | Freq: Every day | ORAL | 5 refills | Status: DC

## 2018-04-13 ENCOUNTER — Other Ambulatory Visit: Payer: Self-pay | Admitting: Physician Assistant

## 2018-05-01 ENCOUNTER — Encounter (HOSPITAL_COMMUNITY): Payer: BLUE CROSS/BLUE SHIELD | Admitting: Internal Medicine

## 2018-05-01 ENCOUNTER — Ambulatory Visit (HOSPITAL_COMMUNITY): Payer: BLUE CROSS/BLUE SHIELD | Attending: Physician Assistant

## 2018-05-01 ENCOUNTER — Other Ambulatory Visit (HOSPITAL_COMMUNITY): Payer: Self-pay | Admitting: *Deleted

## 2018-05-01 DIAGNOSIS — I5022 Chronic systolic (congestive) heart failure: Secondary | ICD-10-CM

## 2018-05-04 ENCOUNTER — Other Ambulatory Visit (HOSPITAL_COMMUNITY): Payer: Self-pay | Admitting: Internal Medicine

## 2018-06-13 ENCOUNTER — Other Ambulatory Visit: Payer: Self-pay | Admitting: Physician Assistant

## 2018-06-25 ENCOUNTER — Ambulatory Visit (HOSPITAL_COMMUNITY)
Admission: RE | Admit: 2018-06-25 | Discharge: 2018-06-25 | Disposition: A | Discharge status: Home / Self Care | Payer: BLUE CROSS/BLUE SHIELD | Source: Ambulatory Visit | Attending: Internal Medicine | Admitting: Internal Medicine

## 2018-06-25 ENCOUNTER — Encounter (HOSPITAL_COMMUNITY): Payer: Self-pay | Admitting: Internal Medicine

## 2018-06-25 ENCOUNTER — Encounter (HOSPITAL_COMMUNITY): Payer: Self-pay | Admitting: *Deleted

## 2018-06-25 VITALS — BP 130/84 | HR 86 | Wt 222.0 lb

## 2018-06-25 DIAGNOSIS — F1010 Alcohol abuse, uncomplicated: Secondary | ICD-10-CM | POA: Diagnosis not present

## 2018-06-25 DIAGNOSIS — G4733 Obstructive sleep apnea (adult) (pediatric): Secondary | ICD-10-CM | POA: Insufficient documentation

## 2018-06-25 DIAGNOSIS — I447 Left bundle-branch block, unspecified: Secondary | ICD-10-CM

## 2018-06-25 DIAGNOSIS — E119 Type 2 diabetes mellitus without complications: Secondary | ICD-10-CM | POA: Insufficient documentation

## 2018-06-25 DIAGNOSIS — I428 Other cardiomyopathies: Secondary | ICD-10-CM | POA: Diagnosis not present

## 2018-06-25 DIAGNOSIS — F17210 Nicotine dependence, cigarettes, uncomplicated: Secondary | ICD-10-CM | POA: Insufficient documentation

## 2018-06-25 DIAGNOSIS — I2510 Atherosclerotic heart disease of native coronary artery without angina pectoris: Secondary | ICD-10-CM | POA: Diagnosis not present

## 2018-06-25 DIAGNOSIS — I110 Hypertensive heart disease with heart failure: Secondary | ICD-10-CM | POA: Insufficient documentation

## 2018-06-25 DIAGNOSIS — I5022 Chronic systolic (congestive) heart failure: Secondary | ICD-10-CM

## 2018-06-25 DIAGNOSIS — E782 Mixed hyperlipidemia: Secondary | ICD-10-CM

## 2018-06-25 DIAGNOSIS — Z79899 Other long term (current) drug therapy: Secondary | ICD-10-CM | POA: Insufficient documentation

## 2018-06-25 DIAGNOSIS — Z8249 Family history of ischemic heart disease and other diseases of the circulatory system: Secondary | ICD-10-CM | POA: Insufficient documentation

## 2018-06-25 DIAGNOSIS — E785 Hyperlipidemia, unspecified: Secondary | ICD-10-CM | POA: Diagnosis not present

## 2018-06-25 DIAGNOSIS — I10 Essential (primary) hypertension: Secondary | ICD-10-CM

## 2018-06-25 DIAGNOSIS — Z794 Long term (current) use of insulin: Secondary | ICD-10-CM | POA: Diagnosis not present

## 2018-06-25 DIAGNOSIS — F17200 Nicotine dependence, unspecified, uncomplicated: Secondary | ICD-10-CM

## 2018-06-25 MED ORDER — EPLERENONE 25 MG PO TABS: 25.0000 mg | ORAL_TABLET | Freq: Every day | ORAL | 6 refills | Status: DC

## 2018-06-25 NOTE — Progress Notes (Signed)
  Echocardiogram 2D Echocardiogram has been performed.  Leta Jungling M 06/25/2018, 11:13 AM

## 2018-06-25 NOTE — Patient Instructions (Signed)
Stop Spironolactone  Start Eplerenone 25 mg daily  You have been ordered a PYP Scan.  This is done in the Radiology Department of [facility].  When you come for this test please plan to be there 2-3 hours.  You have been referred to Dr Ladona Ridgel to discuss getting a defibrillator, his office will call you for an appointment  Your physician recommends that you schedule a follow-up appointment in: 2-3 months

## 2018-06-25 NOTE — Progress Notes (Signed)
Advanced Heart Failure Clinic Note   ZOX:WRUE Dixon PA-C Primary Cardiologist: Dr Gala Romney   HPI: Kevin Yates is a 42 y.o. male  with HTN, IDT2DM, HLD, OSA not on CPAP, Tobacco abuse, and substance abuse.   Previously admitted 10/2017 for multifocal PNA. Had been treated for the flu earlier in the month. Completed course of ABX and discharged home.   Admitted 12/18/2017 with increased dyspnea and volume overload. Diuresed with a few doses of IV lasix. CMRI attempted however he developed hives on shoulders, chest, and legs after receiving 38 cc gadolinium. Also had some lip edema so CMRI was aborted. He received benadryl with resolution. He was discharged on 12/20/2017.   He presents today for regular follow up. Feeling good overall. Walking every day but no longer keeping track of steps. 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 Denies DOE/PND/Orthopnea. Denies lightheadedness or dizziness. He has been having bilateral chest/nipple tenderness for the past several months. Denies fevers or chills. Hasn't needed any prn lasix. Taking all medications as directed. Stopped smoking completely several months ago. Unable to get CPAP machine due to $1000 co-pay  SH: Works in [employer]. Lives with his wife and 2 children. Drinks 1 pint of liquor every weekend. Smokes 2-3 cigarettes a week.   Echo today personally reviewed by Dr. Gala Romney. LVEF 25-30% with significant dyssynchrony.   Echo 12/19/17 LVEF 25-30%, Diffuse HK, Mild MR, Mild LAE, Mild RV dilation, IVC WNL.  (I viewed personally and EF ~20%)   CMRI 12/20/2017 - aborted. Had 38 cc IV gadolinium and developed hives on his legs, chest , shoulders. Also had lip edema. Received benadryl.   RHC 12/19/17: RA mean 6 RV 40/8 PA 39/20 PCWP 23 AO 121/89 Cardiac Output (Fick) 4.8 Cardiac Index (Fick) 2.3  LHC 12/19/17:  Mid RCA lesion is 60% stenosed.  Prox Cx lesion is 40% stenosed.  Prox LAD lesion is 40%  stenosed. Assessment: 1. Non-obstructive CAD 2. 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 Moderately elevated filling pressures with mildly decreased CO  Review of systems complete and found to be negative unless listed in HPI.    SH:  Social History   Socioeconomic History  . Marital status: Married    Spouse name: Not on file  . Number of children: 1  . Years of education: 4  . Highest education level: Not on file  Occupational History  . Not on file  Social Needs  . Financial resource strain: Not on file  . Food insecurity:    Worry: Not on file    Inability: Not on file  . Transportation needs:    Medical: Not on file    Non-medical: Not on file  Tobacco Use  . Smoking status: Current Some Day Smoker    Packs/day: 0.50    Types: Cigarettes    Last attempt to quit: 05/14/2017    Years since quitting: 1.1  . Smokeless tobacco: Never Used  Substance and Sexual Activity  . Alcohol use: Yes    Alcohol/week: 8.0 standard drinks    Types: 8 Standard drinks or equivalent per week    Comment: mostly on weekend  . Drug use: No  . Sexual activity: Yes    Partners: Female  Lifestyle  . Physical activity:    Days per week: Not on file    Minutes per session: Not on file  . Stress: Not on file  Relationships  . Social connections:    Talks on  phone: Not on file    Gets together: Not on file    Attends religious service: Not on file    Active member of club or organization: Not on file    Attends meetings of clubs or organizations: Not on file    Relationship status: Not on file  . Intimate partner violence:    Fear of current or ex partner: Not on file    Emotionally abused: Not on file    Physically abused: Not on file    Forced sexual activity: Not on file  Other Topics Concern  . Not on file  Social History Narrative   Lives with wife and daughter in a 2 story home.  Has one daughter.  Works on used cars.  Education: college.    FH:  Family History  Problem Relation Age  of Onset  . Hypertension Mother   . Hypertension Father     Past Medical History:  Diagnosis Date  . Diabetes mellitus without complication (HCC)   . Hypertension     Current Outpatient Medications  Medication Sig Dispense Refill  . digoxin (LANOXIN) 0.125 MG tablet TAKE 1 TABLET BY MOUTH DAILY 90 tablet 1  . ENTRESTO 49-51 MG Take 1 tablet by mouth 2 (two) times daily.  6  . Insulin Detemir (LEVEMIR FLEXTOUCH) 100 UNIT/ML Pen Inject 50 Units daily at 10 pm into the skin. 5 pen 5  . metoprolol succinate (TOPROL-XL) 25 MG 24 hr tablet TAKE 1 TABLET BY MOUTH EVERY DAY 30 tablet 1  . simvastatin (ZOCOR) 10 MG tablet TAKE 1 TABLET BY MOUTH EVERY EVENING AT BEDTIME 30 tablet 0  . spironolactone (ALDACTONE) 25 MG tablet Take 1 tablet (25 mg total) by mouth daily. 30 tablet 5  . cyclobenzaprine (FLEXERIL) 10 MG tablet Take 1 tablet (10 mg total) by mouth 3 (three) times daily as needed for muscle spasms. (Patient not taking: Reported on 06/25/2018) 30 tablet 0  . furosemide (LASIX) 40 MG tablet Take 1 tablet (40 mg total) by mouth daily as needed for fluid or edema (Or for weight gain more than 5 lbs from baseline). (Patient not taking: Reported on 06/25/2018) 30 tablet 3  . potassium chloride 20 MEQ TBCR Take 20 mEq by mouth daily as needed (If taking furosemide/Lasix). (Patient not taking: Reported on 06/25/2018) 30 tablet 0   No current facility-administered medications for this encounter.    Vitals:   06/25/18 1208  BP: 130/84  Pulse: 86  SpO2: 96%  Weight: 100.7 kg (222 lb)    Wt Readings from Last 3 Encounters:  06/25/18 110.7 kg (244 lb)  02/22/18 98.4 kg (217 lb)  01/15/18 98.6 kg (217 lb 6.4 oz)   PHYSICAL EXAM: General: Well appearing. No resp difficulty. HEENT: Normal Anicteric  Neck: Supple. JVP 6-7 cm. Carotids 2+ bilat; no bruits. No thyromegaly or nodule noted. Cor: PMI nondisplaced. RRR, No M/G/R noted Lungs: CTAB, normal effort.No wheeze Abdomen: Obese. Soft,  non-tender, non-distended, no HSM. No bruits or masses. +BS  Extremities: no cyanosis, clubbing, rash, edema Neuro: alert & oriented x 3, cranial nerves grossly intact. moves all 4 extremities w/o difficulty. Affect pleasant   ASSESSMENT & PLAN: 1. Chronic Systolic Heart Failure - Echo5/8/19LVEF 25-30%, Diffuse HK, Mild MR, Mild LAE, Mild RV dilation, IVC WNL.  - EF may even be as low as 20% with global HK  - NICM. R/LHC 12/19/17 with nonobstructive CAD, severe NICM with EF 20%, moderately elevated filling pressures with mildly decreased CO.  -  CMRI aborted on 5/9 due to allergic reaction. For now hold off on cMRI - NICM.  - Echo today shows LVEF 25-30% with significant dyssynchrony. QRS 140 in May. Will refer to EP for ICD/CRT-D consideration.  - NYHA II symptoms.  - Volume status stable on exam.   - Continue lasix as needed.  - Continue toprol xl 25 mg daily.  - Continue digoxin 0.125 mg daily.  - Continue Entresto 97/103 mg BID at goal/max dose.  - Stop spiro due to gynecomastia. Start Inspra 25 mg daily.  - Reinforced fluid restriction to < 2 L daily, sodium restriction to less than 2000 mg daily, and the importance of daily weights.  - With electrical abnormalities, will also order PYP scan to be sure he doesn't have any infiltrative cardiomyopathy.  2. HTN - Meds as above. 3. OSA - CPAP not functioning. - Encouraged pulmonary follow up. 4. HLD - Continue statin. 5. DMII - Hgb A1C 6 12/18/17 - Per PCP. 6. Smoker - Encouraged smoking cessation. 7. CAD, nonobstructive by cath 12/19/17 as above.  - No s/s of ischemia. - Continue statin. 8. Alcohol abuse - Encouraged complete cessation. 9. LBBB - QRS > 140 in May. - Will refer to EP for ICD/CRT-D consideration.  Kevin Freer, PA-C  12:18 PM   Patient seen and examined with the above-signed Advanced Practice Provider and/or Housestaff. I personally reviewed laboratory data, imaging studies and relevant notes. I  independently examined the patient and formulated the important aspects of the plan. I have edited the note to reflect any of my changes or salient points. I have personally discussed the plan with the patient and/or family.  Echo reviewed personally today. EF persistently 25-30% with severe dyssynchrony due to LBBB. NYHA II. Volume status ok. Will switch spiro to Inspra due to gynecomastia. Long discussion about CRT to fix likely LBBB- cardiomyopathy. Will refer to EP. Check PYP scan for completeness sake to exclude infiltrative CM as contributor to conduction disease. (was unable to tolerate cMRI due to allergy to contrast).  Kevin Meres, MD  12:54 PM

## 2018-07-01 ENCOUNTER — Telehealth (HOSPITAL_COMMUNITY): Payer: Self-pay | Admitting: Vascular Surgery

## 2018-07-01 NOTE — Telephone Encounter (Signed)
Left pt detailed message giving PYP scan appt 11/26 @ 11 am, asked pt to call back to confirm appt

## 2018-07-09 ENCOUNTER — Encounter (HOSPITAL_COMMUNITY): Payer: BLUE CROSS/BLUE SHIELD | Attending: Internal Medicine

## 2018-07-09 ENCOUNTER — Encounter (HOSPITAL_COMMUNITY): Payer: BLUE CROSS/BLUE SHIELD

## 2018-07-12 ENCOUNTER — Other Ambulatory Visit: Payer: Self-pay | Admitting: Family Medicine

## 2018-08-17 ENCOUNTER — Other Ambulatory Visit: Payer: Self-pay | Admitting: Family Medicine

## 2018-08-21 ENCOUNTER — Other Ambulatory Visit (HOSPITAL_COMMUNITY): Payer: Self-pay | Admitting: Adult Health

## 2018-09-19 ENCOUNTER — Encounter (HOSPITAL_COMMUNITY): Payer: Self-pay | Admitting: Cardiology

## 2018-09-19 ENCOUNTER — Encounter (HOSPITAL_COMMUNITY): Payer: Self-pay | Admitting: Internal Medicine

## 2018-09-19 ENCOUNTER — Ambulatory Visit (HOSPITAL_COMMUNITY)
Admission: RE | Admit: 2018-09-19 | Discharge: 2018-09-19 | Disposition: A | Discharge status: Home / Self Care | Payer: BLUE CROSS/BLUE SHIELD | Source: Ambulatory Visit | Attending: Internal Medicine | Admitting: Internal Medicine

## 2018-09-19 VITALS — BP 150/94 | HR 90 | Wt 230.4 lb

## 2018-09-19 DIAGNOSIS — G4733 Obstructive sleep apnea (adult) (pediatric): Secondary | ICD-10-CM

## 2018-09-19 DIAGNOSIS — E782 Mixed hyperlipidemia: Secondary | ICD-10-CM | POA: Diagnosis not present

## 2018-09-19 DIAGNOSIS — E119 Type 2 diabetes mellitus without complications: Secondary | ICD-10-CM | POA: Diagnosis not present

## 2018-09-19 DIAGNOSIS — I428 Other cardiomyopathies: Secondary | ICD-10-CM | POA: Diagnosis not present

## 2018-09-19 DIAGNOSIS — Z8249 Family history of ischemic heart disease and other diseases of the circulatory system: Secondary | ICD-10-CM | POA: Diagnosis not present

## 2018-09-19 DIAGNOSIS — I447 Left bundle-branch block, unspecified: Secondary | ICD-10-CM | POA: Insufficient documentation

## 2018-09-19 DIAGNOSIS — E785 Hyperlipidemia, unspecified: Secondary | ICD-10-CM | POA: Insufficient documentation

## 2018-09-19 DIAGNOSIS — I110 Hypertensive heart disease with heart failure: Secondary | ICD-10-CM | POA: Diagnosis not present

## 2018-09-19 DIAGNOSIS — Z794 Long term (current) use of insulin: Secondary | ICD-10-CM | POA: Insufficient documentation

## 2018-09-19 DIAGNOSIS — F17200 Nicotine dependence, unspecified, uncomplicated: Secondary | ICD-10-CM

## 2018-09-19 DIAGNOSIS — F17210 Nicotine dependence, cigarettes, uncomplicated: Secondary | ICD-10-CM | POA: Insufficient documentation

## 2018-09-19 DIAGNOSIS — Z79899 Other long term (current) drug therapy: Secondary | ICD-10-CM | POA: Insufficient documentation

## 2018-09-19 DIAGNOSIS — I5022 Chronic systolic (congestive) heart failure: Secondary | ICD-10-CM | POA: Insufficient documentation

## 2018-09-19 DIAGNOSIS — I10 Essential (primary) hypertension: Secondary | ICD-10-CM | POA: Diagnosis not present

## 2018-09-19 DIAGNOSIS — I2510 Atherosclerotic heart disease of native coronary artery without angina pectoris: Secondary | ICD-10-CM | POA: Diagnosis not present

## 2018-09-19 DIAGNOSIS — F1010 Alcohol abuse, uncomplicated: Secondary | ICD-10-CM

## 2018-09-19 LAB — BASIC METABOLIC PANEL
Anion gap: 13 (ref 5–15)
BUN: 9 mg/dL (ref 6–20)
CHLORIDE: 102 mmol/L (ref 98–111)
CO2: 24 mmol/L (ref 22–32)
CREATININE: 1.21 mg/dL (ref 0.61–1.24)
Calcium: 9.3 mg/dL (ref 8.9–10.3)
Glucose, Bld: 111 mg/dL — ABNORMAL HIGH (ref 70–99)
POTASSIUM: 4.5 mmol/L (ref 3.5–5.1)
Sodium: 139 mmol/L (ref 135–145)

## 2018-09-19 MED ORDER — EPLERENONE 25 MG PO TABS: 25.0000 mg | ORAL_TABLET | Freq: Every day | ORAL | 6 refills | Status: AC

## 2018-09-19 MED ORDER — SACUBITRIL-VALSARTAN 97-103 MG PO TABS: 1.0000 | ORAL_TABLET | Freq: Two times a day (BID) | ORAL | 6 refills | Status: AC

## 2018-09-19 MED ORDER — METOPROLOL SUCCINATE ER 50 MG PO TB24: 50.0000 mg | ORAL_TABLET | Freq: Every day | ORAL | 6 refills | Status: AC

## 2018-09-19 NOTE — Patient Instructions (Signed)
Labs done today. We will contact you for any abnormal lab work.  Labs will need to be done again in 2 weeks.  INCREASE Entresto 97-103 (1 tab) twice daily  INCREASE Toprolol 50mg  (1 tab) daily  REFILLED Inspra  Your physician has requested that you have an echocardiogram. Echocardiography is a painless test that uses sound waves to create images of your heart. It provides your doctor with information about the size and shape of your heart and how well your heart's chambers and valves are working. This procedure takes approximately one hour. There are no restrictions for this procedure.  Follow up with [place] in 4-6 weeks

## 2018-09-19 NOTE — Progress Notes (Signed)
Advanced Heart Failure Clinic Note   YPP:JKDT Dixon PA-C Primary Cardiologist: Dr Gala Romney   HPI: Kevin Yates is a 43 y.o. male  with HTN, IDT2DM, HLD, OSA not on CPAP, Tobacco abuse, and substance abuse.   Previously admitted 10/2017 for multifocal PNA. Had been treated for the flu earlier in the month. Completed course of ABX and discharged home.   Admitted 12/18/2017 with increased dyspnea and volume overload. Diuresed with a few doses of IV lasix. CMRI attempted however he developed hives on shoulders, chest, and legs after receiving 38 cc gadolinium. Also had some lip edema so CMRI was aborted. He received benadryl with resolution. He was discharged on 12/20/2017.   He presents today for regular follow up. Last visit, switched to inspra. Never heard back from EP. Recently hired permanently, so has made his schedule more intense. He only gets "8 points" a year, and has to use one for a doctors appointments.  He will have to use one for any appointments he have, but may be able to talk to HR and get some FMLA time to use for his. Still hasn't followed up on a new CPAP machine. Would have been > $1000 for him to get. He has new insurance now, so is going to see if it is easier to get now. Not smoking. Denies any undue SOB. ? If he had mild orthopnea yesterday. Denies DOE, lightheadedness, dizziness, or CP. Now driving a fork lift. He reports he is on Entresto, but not sure what dose.   SH: Works in a [employer]. Lives with his wife and 2 children. Drinks 1 pint of liquor every weekend. Smokes 2-3 cigarettes a week.   Echo 06/2018 LVEF 25-30% with significant dyssynchrony.   Echo 12/19/17 LVEF 25-30%, Diffuse HK, Mild MR, Mild LAE, Mild RV dilation, IVC WNL.  (I viewed personally and EF ~20%)   CMRI 12/20/2017 - aborted. Had 38 cc IV gadolinium and developed hives on his legs, chest , shoulders. Also had lip edema. Received benadryl.   RHC 12/19/17: RA mean 6 RV 40/8 PA 39/20 PCWP 23 AO  121/89 Cardiac Output (Fick) 4.8 Cardiac Index (Fick) 2.3  LHC 12/19/17:  Mid RCA lesion is 60% stenosed.  Prox Cx lesion is 40% stenosed.  Prox LAD lesion is 40% stenosed. Assessment: 1. Non-obstructive CAD 2. 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 Moderately elevated filling pressures with mildly decreased CO  Review of systems complete and found to be negative unless listed in HPI.    SH:  Social History   Socioeconomic History  . Marital status: Married    Spouse name: Not on file  . Number of children: 1  . Years of education: 108  . Highest education level: Not on file  Occupational History  . Not on file  Social Needs  . Financial resource strain: Not on file  . Food insecurity:    Worry: Not on file    Inability: Not on file  . Transportation needs:    Medical: Not on file    Non-medical: Not on file  Tobacco Use  . Smoking status: Current Some Day Smoker    Packs/day: 0.50    Types: Cigarettes    Last attempt to quit: 05/14/2017    Years since quitting: 1.3  . Smokeless tobacco: Never Used  Substance and Sexual Activity  . Alcohol use: Yes    Alcohol/week: 8.0 standard drinks    Types: 8 Standard drinks or equivalent per week    Comment: mostly  on weekend  . Drug use: No  . Sexual activity: Yes    Partners: Female  Lifestyle  . Physical activity:    Days per week: Not on file    Minutes per session: Not on file  . Stress: Not on file  Relationships  . Social connections:    Talks on phone: Not on file    Gets together: Not on file    Attends religious service: Not on file    Active member of club or organization: Not on file    Attends meetings of clubs or organizations: Not on file    Relationship status: Not on file  . Intimate partner violence:    Fear of current or ex partner: Not on file    Emotionally abused: Not on file    Physically abused: Not on file    Forced sexual activity: Not on file  Other Topics Concern  . Not on file  Social History  Narrative   Lives with wife and daughter in a 2 story home.  Has one daughter.  Works on used cars.  Education: college.    FH:  Family History  Problem Relation Age of Onset  . Hypertension Mother   . Hypertension Father     Past Medical History:  Diagnosis Date  . Diabetes mellitus without complication (HCC)   . Hypertension     Current Outpatient Medications  Medication Sig Dispense Refill  . digoxin (LANOXIN) 0.125 MG tablet TAKE 1 TABLET BY MOUTH DAILY 90 tablet 1  . ENTRESTO 49-51 MG TAKE 1 TABLET BY MOUTH TWICE A DAY 60 tablet 6  . eplerenone (INSPRA) 25 MG tablet Take 1 tablet (25 mg total) by mouth daily. 30 tablet 6  . furosemide (LASIX) 40 MG tablet Take 1 tablet (40 mg total) by mouth daily as needed for fluid or edema (Or for weight gain more than 5 lbs from baseline). 30 tablet 3  . Insulin Detemir (LEVEMIR FLEXTOUCH) 100 UNIT/ML Pen Inject 50 Units daily at 10 pm into the skin. 5 pen 5  . metoprolol succinate (TOPROL-XL) 25 MG 24 hr tablet Take 1 tablet (25 mg total) by mouth daily. Needs office visit and labs before further refills 30 tablet 0  . potassium chloride 20 MEQ TBCR Take 20 mEq by mouth daily as needed (If taking furosemide/Lasix). 30 tablet 0  . simvastatin (ZOCOR) 10 MG tablet TAKE 1 TABLET BY MOUTH EVERY EVENING AT BEDTIME 30 tablet 0  . cyclobenzaprine (FLEXERIL) 10 MG tablet Take 1 tablet (10 mg total) by mouth 3 (three) times daily as needed for muscle spasms. (Patient not taking: Reported on 06/25/2018) 30 tablet 0   No current facility-administered medications for this encounter.    Vitals:   09/19/18 1019  BP: (!) 150/94  Pulse: 90  SpO2: 97%  Weight: 104.5 kg (230 lb 6.4 oz)    Wt Readings from Last 3 Encounters:  09/19/18 104.5 kg (230 lb 6.4 oz)  06/25/18 100.7 kg (222 lb)  02/22/18 98.4 kg (217 lb)   PHYSICAL EXAM: General: Well appearing. No resp difficulty. HEENT: Normal Neck: Supple. JVP 6-7 cm. Carotids 2+ bilat; no bruits. No  thyromegaly or nodule noted. Cor: PMI nondisplaced. RRR, Split S2.  Lungs: CTAB, normal effort. Abdomen: Soft, non-tender, non-distended, no HSM. No bruits or masses. +BS  Extremities: No cyanosis, clubbing, or rash. Trace ankle edema. Neuro: Alert & orientedx3, cranial nerves grossly intact. moves all 4 extremities w/o difficulty. Affect pleasant   ASSESSMENT &  PLAN: 1. Chronic Systolic Heart Failure - Echo5/8/19LVEF 25-30%, Diffuse HK, Mild MR, Mild LAE, Mild RV dilation, IVC WNL.  - EF may even be as low as 20% with global HK  - NICM. R/LHC 12/19/17 with nonobstructive CAD, severe NICM with EF 20%, moderately elevated filling pressures with mildly decreased CO.  - cMRI aborted on 5/9 due to allergic reaction. EF 16% but unable to get LGE images.  - Echo 06/25/2018 LVEF 25-30% with significant dyssynchrony. QRS 140 in May.  - Will repeat Echo in 4-6 weeks. If EF remains <35%, will re-refer to EP for ICD/CRT-D consideration.  - He did not show up for PYP scan.  - NYHA II symptoms - Volume status stable on exam.   - Continue lasix as needed.  - Increase toprol xl to 50 mg daily.  - Continue digoxin 0.125 mg daily.  - Increase Entresto to 97/103 mg BID - Continue Inspra 25 mg daily.  - Consider Bidil  - Reinforced fluid restriction to < 2 L daily, sodium restriction to less than 2000 mg daily, and the importance of daily weights.    2. HTN - Meds as above.  3. OSA - CPAP as above. Can't afford right now.  - Encouraged pulmonary follow up. 4. HLD - Continue statin.  5. DMII - Hgb A1C 6 12/18/17 - Per PCP. 6. Smoker - Encouraged smoking cessation.  7. CAD, nonobstructive by cath 12/19/17 as above.  - No s/s of ischemia.    - Continue statin. 8. Alcohol abuse - Encouraged complete cessation.  9. LBBB - QRS > 140 in May. - Repeat echo 4-6 weeks. If EF remains < 35%, will placed referral for ICD/CRT-D consideration.  Meds and labs as above. RTC 4-6 weeks with Echo for ICD  consideration.   Graciella Freer, PA-C  10:19 AM   Patient seen and examined with the above-signed Advanced Practice Provider and/or Housestaff. I personally reviewed laboratory data, imaging studies and relevant notes. I independently examined the patient and formulated the important aspects of the plan. I have edited the note to reflect any of my changes or salient points. I have personally discussed the plan with the patient and/or family.  Continues to improve. NYHA II. Volume status looks good. BP remains elevated. Will increase Entresto to 97/103 bid and increase Toprol. He was scheduled to see EP for ICD but missed appt. Will continue to titrate HF meds and repeat echo in 1 month. If EF remains < = 35% consider CRT-D. Will need to start CPAP but having trouble affording equipment.   Arvilla Meres, MD  11:19 AM

## 2018-09-26 ENCOUNTER — Other Ambulatory Visit: Payer: Self-pay | Admitting: Family Medicine

## 2018-10-03 ENCOUNTER — Other Ambulatory Visit (HOSPITAL_COMMUNITY): Payer: BLUE CROSS/BLUE SHIELD

## 2018-10-13 DEATH — deceased

## 2018-10-30 ENCOUNTER — Telehealth (HOSPITAL_COMMUNITY): Payer: Self-pay

## 2018-10-30 NOTE — Telephone Encounter (Signed)
Called pt to reschedule appointment. No voicemail set up.

## 2018-11-04 ENCOUNTER — Encounter (HOSPITAL_COMMUNITY): Payer: BLUE CROSS/BLUE SHIELD

## 2018-11-04 ENCOUNTER — Ambulatory Visit (HOSPITAL_COMMUNITY): Payer: BLUE CROSS/BLUE SHIELD

## 2018-11-26 ENCOUNTER — Other Ambulatory Visit (HOSPITAL_COMMUNITY): Payer: Self-pay | Admitting: Internal Medicine

## 2019-03-17 NOTE — Telephone Encounter (Signed)
Reason for Disposition  ??? Can't move injured shoulder normally (e.g., full range of motion, able to touch top of head)    Answer Assessment - Initial Assessment Questions  1. MECHANISM: "How did the injury happen?"      Fall, climbing down hill slipped landed on buttocks and put arm out to catch slef  2. ONSET: "When did the injury happen?" (Minutes or hours ago)       10 days ago  3. APPEARANCE of INJURY: "What does the injury look like?"         4. SEVERITY: "Can you move the shoulder normally?"       *No Answer*  5. SIZE: For cuts, bruises, or swelling, ask: "How large is it?" (e.g., inches or centimeters;  entire joint)       No bruising  6. PAIN: "Is there pain?" If so, ask: "How bad is the pain?"   (e.g., Scale 1-10; or mild, moderate, severe)      7  7. TETANUS: For any breaks in the skin, ask: "When was the last tetanus booster?"      *No Answer*  8. OTHER SYMPTOMS: "Do you have any other symptoms?" (e.g., loss of sensation)      *No Answer*  9. PREGNANCY: "Is there any chance you are pregnant?" "When was your last menstrual period?"      *No Answer*    Protocols used: SHOULDER INJURY-ADULT-[place]    Transferred call to [place]

## 2019-03-18 ENCOUNTER — Ambulatory Visit: Admit: 2019-03-18 | Payer: BLUE CROSS/BLUE SHIELD | Attending: Internal Medicine | Primary: Internal Medicine

## 2019-03-18 ENCOUNTER — Inpatient Hospital Stay: Payer: BLUE CROSS/BLUE SHIELD | Primary: Internal Medicine

## 2019-03-18 ENCOUNTER — Inpatient Hospital Stay: Admit: 2019-03-18 | Payer: BLUE CROSS/BLUE SHIELD | Primary: Internal Medicine

## 2019-03-18 DIAGNOSIS — M25512 Pain in left shoulder: Secondary | ICD-10-CM

## 2019-03-18 MED ORDER — METHOCARBAMOL 750 MG PO TABS
750 MG | ORAL_TABLET | Freq: Four times a day (QID) | ORAL | 0 refills | Status: AC
Start: 2019-03-18 — End: 2019-03-28

## 2019-03-18 MED ORDER — AMPHETAMINE-DEXTROAMPHET ER 20 MG PO CP24
20 MG | ORAL_CAPSULE | Freq: Every morning | ORAL | 0 refills | Status: DC
Start: 2019-03-18 — End: 2019-05-15

## 2019-03-18 MED ORDER — NAPROXEN 500 MG PO TABS
500 MG | ORAL_TABLET | Freq: Two times a day (BID) | ORAL | 0 refills | Status: AC
Start: 2019-03-18 — End: ?

## 2019-03-18 NOTE — Assessment & Plan Note (Signed)
On adderall,  Patient is compliant w medications, no side effects, effective, provides adequate symptom relief. No new symptoms or problems as noted by patient.  The problem is stable, no changes noted by patient. Will consider monitoring labs and refill medications as appropriate. Patient counseled and will continue current plan.

## 2019-03-18 NOTE — Progress Notes (Signed)
Subjective:      Patient ID: [name] is a 43 y.o. male.    HPI  Here for an acute left shoulder injujry,  Attention deficit hyperactivity disorder (ADHD), predominantly hyperactive type  On adderall,  Patient is compliant w medications, no side effects, effective, provides adequate symptom relief. No new symptoms or problems as noted by patient.  The problem is stable, no changes noted by patient. Will consider monitoring labs and refill medications as appropriate. Patient counseled and will continue current plan.     Acute pain of left shoulder  Had a shoulder injury , fell 2 weeks ago,   The shoulder has been jarred and has pain on any Range of motion,   Now has painful Range of motion , mainly abduction, flexion, and rotation,  Also has pain in the left upper back,   Sounds like rotator cuff injury.   Tried some stretching and some otc meds,  Has not been x rayed yet.   Will start some nsaids, and muscle relaxants,        Review of Systems  All the review of systems negative except above   Objective:   Physical Exam  BP 128/74    Pulse 65    Temp 98.4 ??F (36.9 ??C) (Oral)    Resp 16    Ht 5\' 7"  (1.702 m)    Wt 228 lb (103.4 kg)    SpO2 97%    BMI 35.71 kg/m??    The physical exam reveals a patient who appears well, alert and oriented x 3, pleasant, cooperative. Vitals are as noted. Head is atraumatic and normocephalic. Eyes reveal normal conjunctiva, cornea normal, pupils are equal and rective to light. Nasal mucosa is normal. Throat is normal without exudates. Ears reveal normal tympanic membranes.Neck is supple and free of adenopathy, or masses. No thyromegaly. No jugular venous distension. Lungs are clear to auscultation, no rales or rhonchi noted. Heart sounds are regular , no murmurs, clicks, gallops or rubs. Abdomen is soft, no tenderness, masses or organomegaly. Bowel sounds are normally heard.Pelvis: normal. Extremities are normal. Peripheral pulses are normal. Screening neurological exam is normal  without focal findings. Cranial nerves are intact, reflexes are symmetrical and muscle strength eaqual. Skin is normal without suspicious lesions noted.   left shoulder- has pain on passive Range of motion on abduction, flexion and rotation, no dislocation.   Assessment:      Acute left shoulder injury  Attention deficit hyperactivity disorder (ADHD), predominantly hyperactive type  On adderall,  Patient is compliant w medications, no side effects, effective, provides adequate symptom relief. No new symptoms or problems as noted by patient.  The problem is stable, no changes noted by patient. Will consider monitoring labs and refill medications as appropriate. Patient counseled and will continue current plan.     Acute pain of left shoulder  Had a shoulder injury , fell 2 weeks ago,   The shoulder has been jarred and has pain on any Range of motion,   Now has painful Range of motion , mainly abduction, flexion, and rotation,  Also has pain in the left upper back,   Sounds like rotator cuff injury.   Tried some stretching and some otc meds,  Has not been x rayed yet.   Will start some nsaids, and muscle relaxants,          Plan:      As above,  Will get xray, start some meds,   Will erefr to  ortho         Marcello FennelPrashanth R Grantham Hippert, MD

## 2019-03-18 NOTE — Assessment & Plan Note (Signed)
Had a shoulder injury , fell 2 weeks ago,   The shoulder has been jarred and has pain on any Range of motion,   Now has painful Range of motion , mainly abduction, flexion, and rotation,  Also has pain in the left upper back,   Sounds like rotator cuff injury.   Tried some stretching and some otc meds,  Has not been x rayed yet.   Will start some nsaids, and muscle relaxants,

## 2019-03-18 NOTE — Addendum Note (Signed)
Addended by: Marcello Fennel on: 03/18/2019 02:35 PM     Modules accepted: Orders, Level of Service

## 2019-05-15 ENCOUNTER — Telehealth

## 2019-05-15 MED ORDER — AMPHETAMINE-DEXTROAMPHET ER 20 MG PO CP24
20 MG | ORAL_CAPSULE | Freq: Every morning | ORAL | 0 refills | Status: AC
Start: 2019-05-15 — End: 2019-06-14

## 2019-05-15 NOTE — Telephone Encounter (Signed)
Pt is asking for a refill of his Adderall XR 20 mg.  Pt takes 1 capsule every morning for 30 days.    Send to [facility] [address], [place] - [address] - P [phone] - F [phone]     LOV 03/18/19

## 2020-02-12 IMAGING — MR MR CARD MORPHOLOGY WO/W CM
6 series · 16 of 16 positions shown · IV contrast (multihance)
Comparison: none

CLINICAL DATA: Cardiomyopathy of uncertain etiology.

EXAM:
CARDIAC MRI
TECHNIQUE: The patient was scanned on a 1.5 Tesla GE magnet. A dedicated
cardiac coil was used. Functional imaging was done using [employer]
sequences. [DATE], and 4 chamber views were done to assess for RWMA's.
Modified Rami rule using a short axis stack was used to
calculate an ejection fraction on a dedicated work station using
[employer] software. The patient received 38 cc of Multihance. He was
unable to do delayed enhancement images due to an allergic reaction
to the contrast. The study was terminated.
CONTRAST:  38 cc Multihance.

[Series 3: bSSFP · sagittal · 8.0mm · 1.56mm/px · 1 of 14 slices shown (1 of 3)]
[im 1/14]
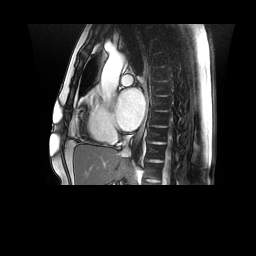

[Series 4: bSSFP · axial · 8.0mm · 1.56mm/px · 1 of 20 slices shown (2 of 3)]
[im 1/20]
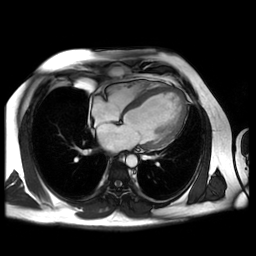

[Series 5: bSSFP · sagittal · 8.0mm · 1.37mm/px · 11 of 360 slices shown (3 of 3)]
[im 1/360]
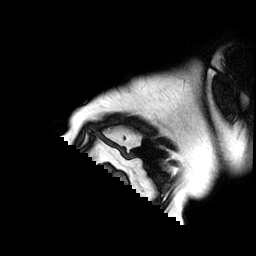
[im 36/360]
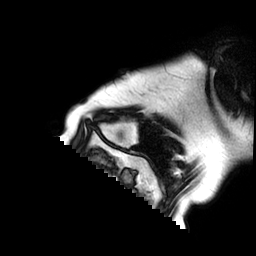
[im 72/360]
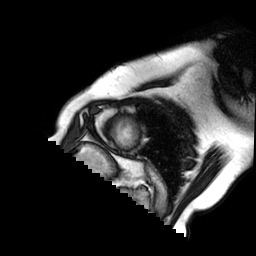
[im 108/360]
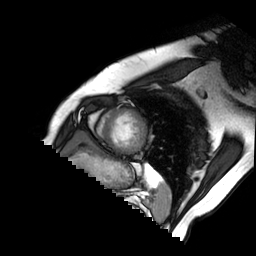
[im 144/360]
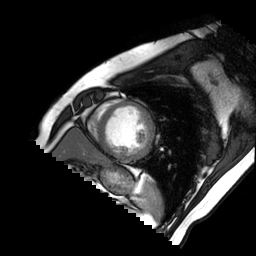
[im 180/360]
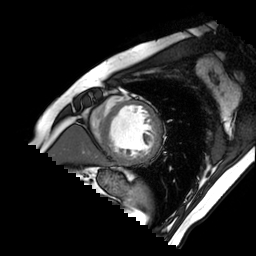
[im 216/360]
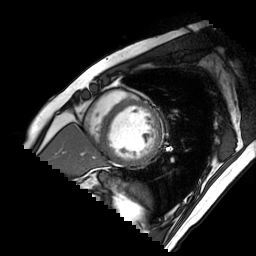
[im 252/360]
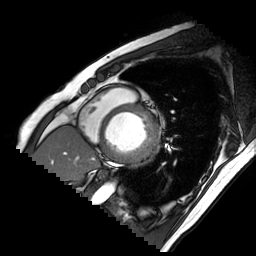
[im 288/360]
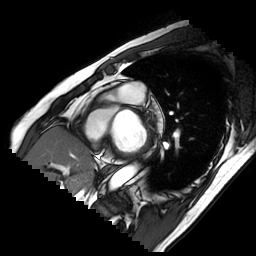
[im 324/360]
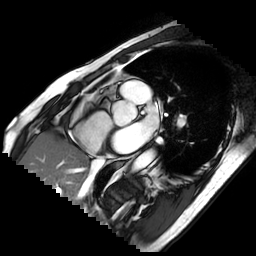
[im 360/360]
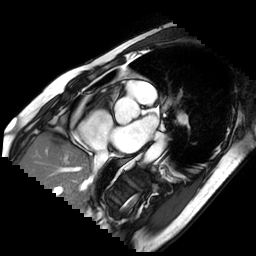

[Series 6: T1 · oblique · 10.0mm · 1.64mm/px · 1 of 9 slices shown (1 of 2)]
[im 1/9]
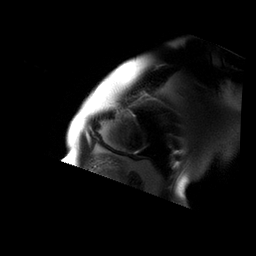

[Series 7: T1 · oblique · 10.0mm · 1.64mm/px · 1 of 9 slices shown (2 of 2)]
[im 1/9]
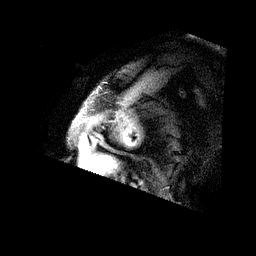

[Series 8: T2 · oblique · 8.0mm · 1.72mm/px · 1 of 20 slices shown]
[im 1/20]
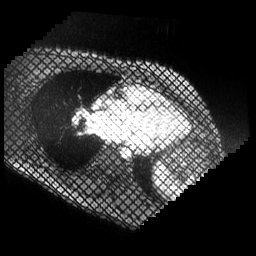

[16 of 16 positions shown; findings below may reference images not displayed]

FINDINGS: Limited images of the lung fields showed no acute abnormalities.

Mildly dilated left ventricle with normal wall thickness.
Septal-lateral dyssynchrony with diffuse hypokinesis, EF 16%. Normal
right ventricular size with mildly decreased systolic function. Mild
left atrial enlargement. Normal right atrial size. Trileaflet aortic
valve without significant stenosis or regurgitation. There did not
appear to be significant mitral regurgitation.

Unable to obtain delayed enhancement images due to a contrast
reaction. Study was discontinued.

Measurements:

LVEDV 267 mL

LVSV 43 mL

LVEF 16%
IMPRESSION: 1. Mildly dilated LV with diffuse hypokinesis, septal-lateral
dyssynchrony. EF 16%.

2.  Normal RV size with mildly decreased systolic function.

3. Delayed enhancement images not obtained due to contrast reaction,
study was stopped.

Brigette Richer
